# Patient Record
Sex: Male | Born: 1935 | Race: White | Hispanic: No | Marital: Married | Smoking: Current every day smoker
Health system: Southern US, Community
[De-identification: ages and names within clinical notes are randomized; demographics above are authoritative.]

## PROBLEM LIST (undated history)

## (undated) DIAGNOSIS — L409 Psoriasis, unspecified: Secondary | ICD-10-CM

## (undated) DIAGNOSIS — F32A Depression, unspecified: Secondary | ICD-10-CM

## (undated) DIAGNOSIS — J449 Chronic obstructive pulmonary disease, unspecified: Secondary | ICD-10-CM

## (undated) DIAGNOSIS — F329 Major depressive disorder, single episode, unspecified: Secondary | ICD-10-CM

## (undated) DIAGNOSIS — I110 Hypertensive heart disease with heart failure: Secondary | ICD-10-CM

## (undated) DIAGNOSIS — J439 Emphysema, unspecified: Secondary | ICD-10-CM

## (undated) DIAGNOSIS — G2581 Restless legs syndrome: Secondary | ICD-10-CM

## (undated) DIAGNOSIS — K219 Gastro-esophageal reflux disease without esophagitis: Secondary | ICD-10-CM

## (undated) HISTORY — PX: TOTAL HIP ARTHROPLASTY: SHX124

## (undated) HISTORY — PX: CHOLECYSTECTOMY: SHX55

## (undated) HISTORY — PX: BACK SURGERY: SHX140

## (undated) HISTORY — PX: ROTATOR CUFF REPAIR: SHX139

---
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
## 2000-04-06 ENCOUNTER — Encounter: Admission: RE | Admit: 2000-04-06 | Discharge: 2000-04-06 | Payer: Self-pay | Admitting: *Deleted

## 2000-04-06 ENCOUNTER — Encounter: Payer: Self-pay | Admitting: *Deleted

## 2000-05-12 ENCOUNTER — Other Ambulatory Visit: Admission: RE | Admit: 2000-05-12 | Discharge: 2000-05-12 | Payer: Self-pay | Admitting: Urology

## 2000-06-13 ENCOUNTER — Observation Stay (HOSPITAL_COMMUNITY): Admission: RE | Admit: 2000-06-13 | Discharge: 2000-06-14 | Payer: Self-pay | Admitting: Specialist

## 2000-08-01 ENCOUNTER — Ambulatory Visit (HOSPITAL_COMMUNITY): Admission: RE | Admit: 2000-08-01 | Discharge: 2000-08-01 | Payer: Self-pay | Admitting: Gastroenterology

## 2004-12-29 ENCOUNTER — Encounter: Admission: RE | Admit: 2004-12-29 | Discharge: 2004-12-29 | Payer: Self-pay | Admitting: Ophthalmology

## 2004-12-31 ENCOUNTER — Ambulatory Visit (HOSPITAL_COMMUNITY): Admission: RE | Admit: 2004-12-31 | Discharge: 2004-12-31 | Payer: Self-pay | Admitting: Ophthalmology

## 2004-12-31 ENCOUNTER — Ambulatory Visit (HOSPITAL_BASED_OUTPATIENT_CLINIC_OR_DEPARTMENT_OTHER): Admission: RE | Admit: 2004-12-31 | Discharge: 2004-12-31 | Payer: Self-pay | Admitting: Ophthalmology

## 2005-07-15 ENCOUNTER — Ambulatory Visit (HOSPITAL_BASED_OUTPATIENT_CLINIC_OR_DEPARTMENT_OTHER): Admission: RE | Admit: 2005-07-15 | Discharge: 2005-07-15 | Payer: Self-pay | Admitting: Ophthalmology

## 2005-08-05 ENCOUNTER — Encounter: Admission: RE | Admit: 2005-08-05 | Discharge: 2005-08-05 | Payer: Self-pay | Admitting: Orthopedic Surgery

## 2005-09-12 ENCOUNTER — Inpatient Hospital Stay (HOSPITAL_COMMUNITY): Admission: RE | Admit: 2005-09-12 | Discharge: 2005-09-15 | Payer: Self-pay | Admitting: Orthopedic Surgery

## 2006-02-15 ENCOUNTER — Ambulatory Visit (HOSPITAL_COMMUNITY): Admission: RE | Admit: 2006-02-15 | Discharge: 2006-02-15 | Payer: Self-pay | Admitting: Orthopedic Surgery

## 2006-03-29 ENCOUNTER — Ambulatory Visit (HOSPITAL_COMMUNITY): Admission: RE | Admit: 2006-03-29 | Discharge: 2006-03-29 | Payer: Self-pay | Admitting: Neurosurgery

## 2006-05-19 ENCOUNTER — Inpatient Hospital Stay (HOSPITAL_COMMUNITY): Admission: RE | Admit: 2006-05-19 | Discharge: 2006-05-22 | Payer: Self-pay | Admitting: Orthopedic Surgery

## 2006-06-12 ENCOUNTER — Ambulatory Visit (HOSPITAL_COMMUNITY): Admission: RE | Admit: 2006-06-12 | Discharge: 2006-06-12 | Payer: Self-pay | Admitting: Family Medicine

## 2012-04-12 ENCOUNTER — Other Ambulatory Visit: Payer: Self-pay | Admitting: Oral Surgery

## 2012-04-12 ENCOUNTER — Ambulatory Visit
Admission: RE | Admit: 2012-04-12 | Discharge: 2012-04-12 | Disposition: A | Discharge status: Home / Self Care | Payer: Self-pay | Source: Ambulatory Visit | Attending: Oral Surgery | Admitting: Oral Surgery

## 2012-04-12 DIAGNOSIS — T189XXA Foreign body of alimentary tract, part unspecified, initial encounter: Secondary | ICD-10-CM

## 2013-04-16 ENCOUNTER — Encounter (HOSPITAL_COMMUNITY): Payer: Self-pay | Admitting: Emergency Medicine

## 2013-04-16 ENCOUNTER — Emergency Department (HOSPITAL_COMMUNITY)
Admission: EM | Admit: 2013-04-16 | Discharge: 2013-04-16 | Disposition: A | Discharge status: Home / Self Care | Payer: Medicare Other | Attending: Emergency Medicine | Admitting: Emergency Medicine

## 2013-04-16 DIAGNOSIS — L03211 Cellulitis of face: Secondary | ICD-10-CM | POA: Insufficient documentation

## 2013-04-16 DIAGNOSIS — F17200 Nicotine dependence, unspecified, uncomplicated: Secondary | ICD-10-CM | POA: Insufficient documentation

## 2013-04-16 DIAGNOSIS — Z8669 Personal history of other diseases of the nervous system and sense organs: Secondary | ICD-10-CM | POA: Insufficient documentation

## 2013-04-16 DIAGNOSIS — K219 Gastro-esophageal reflux disease without esophagitis: Secondary | ICD-10-CM | POA: Insufficient documentation

## 2013-04-16 DIAGNOSIS — Z792 Long term (current) use of antibiotics: Secondary | ICD-10-CM | POA: Insufficient documentation

## 2013-04-16 DIAGNOSIS — Z872 Personal history of diseases of the skin and subcutaneous tissue: Secondary | ICD-10-CM | POA: Insufficient documentation

## 2013-04-16 DIAGNOSIS — L0201 Cutaneous abscess of face: Secondary | ICD-10-CM | POA: Insufficient documentation

## 2013-04-16 DIAGNOSIS — Z7982 Long term (current) use of aspirin: Secondary | ICD-10-CM | POA: Insufficient documentation

## 2013-04-16 DIAGNOSIS — Z79899 Other long term (current) drug therapy: Secondary | ICD-10-CM | POA: Insufficient documentation

## 2013-04-16 HISTORY — DX: Psoriasis, unspecified: L40.9

## 2013-04-16 HISTORY — DX: Restless legs syndrome: G25.81

## 2013-04-16 HISTORY — DX: Gastro-esophageal reflux disease without esophagitis: K21.9

## 2013-04-16 LAB — CBC WITH DIFFERENTIAL/PLATELET
Basophils Absolute: 0 10*3/uL (ref 0.0–0.1)
Basophils Relative: 1 % (ref 0–1)
HCT: 44.8 % (ref 39.0–52.0)
Lymphs Abs: 1.4 10*3/uL (ref 0.7–4.0)
MCHC: 34.2 g/dL (ref 30.0–36.0)
MCV: 89.1 fL (ref 78.0–100.0)
Monocytes Relative: 12 % (ref 3–12)
Neutrophils Relative %: 65 % (ref 43–77)
Platelets: 225 10*3/uL (ref 150–400)
WBC: 7.2 10*3/uL (ref 4.0–10.5)

## 2013-04-16 LAB — BASIC METABOLIC PANEL
CO2: 24 mEq/L (ref 19–32)
Calcium: 8.8 mg/dL (ref 8.4–10.5)
Chloride: 102 mEq/L (ref 96–112)
Creatinine, Ser: 0.72 mg/dL (ref 0.50–1.35)
GFR calc Af Amer: >90 mL/min (ref >90)
Sodium: 137 mEq/L (ref 135–145)

## 2013-04-16 MED ORDER — CLINDAMYCIN HCL 150 MG PO CAPS: 450.0000 mg | ORAL_CAPSULE | Freq: Three times a day (TID) | ORAL | Status: DC

## 2013-04-16 MED ORDER — CLINDAMYCIN PHOSPHATE 600 MG/50ML IV SOLN
600.0000 mg | Freq: Once | INTRAVENOUS | Status: AC
  Administered 2013-04-16: 600 mg via INTRAVENOUS
  Filled 2013-04-16: qty 50

## 2013-04-16 NOTE — ED Notes (Signed)
Pt states that he started noticing a rash on his forehead on Friday night and went to see his PCP yesterday. Started taking cephalexin but today the rash has moved down and his eyes are very edematous. Dr. Gibson Ramp thinks this may be r/t his psoriasis and wanted to send him in for eval to see if he needs IV antibiotics. Pt alert and oriented.

## 2013-04-16 NOTE — ED Notes (Signed)
Comfort measures provided,  Bed adjusted per pt request,  IV antibiotic infusing ,  Pt is watching TV.  When pt is ready to be discharged home I am to call his wife  Jasmine December at [phone]

## 2013-04-17 NOTE — ED Provider Notes (Signed)
CSN: [id number]     Arrival date & time 04/16/13  2010 History   First MD Initiated Contact with Patient 04/16/13 2041     Chief Complaint  Patient presents with  . Facial Swelling   (Consider location/radiation/quality/duration/timing/severity/associated sxs/prior Treatment) HPI Comments: Pt presents w/ 2 days worsening redness of face, spreading from forehead now to nose and periorbital areas.  No fever, chills.  He was put on keflex by his dermatologist which he begun two days ago. He states his forehead was edematous yesterday, and today has periorbital edema.  Patient is a 79 y.o. male presenting with rash.  Rash Location:  Face Facial rash location:  L eyelid, R eyelid, forehead and nose Quality: redness   Severity:  Mild Onset quality:  Gradual Duration:  4 days Timing:  Constant Progression:  Worsening Chronicity:  New Relieved by:  Nothing Worsened by:  Nothing tried Ineffective treatments: keflex. Associated symptoms: periorbital edema   Associated symptoms: no abdominal pain, no diarrhea, no fatigue, no fever, no headaches, no myalgias, no nausea, no shortness of breath and not vomiting     Past Medical History  Diagnosis Date  . Restless leg   . Psoriasis   . GERD (gastroesophageal reflux disease)    Past Surgical History  Procedure Laterality Date  . Rotator cuff repair Bilateral   . Back surgery    . Cholecystectomy    . Total hip arthroplasty Left    No family history on file. History  Substance Use Topics  . Smoking status: Current Every Day Smoker    Types: Pipe  . Smokeless tobacco: Not on file  . Alcohol Use: 7.0 oz/week    14 drink(s) per week    Review of Systems  Constitutional: Negative for fever, activity change, appetite change and fatigue.  HENT: Negative for congestion, facial swelling, rhinorrhea and trouble swallowing.   Eyes: Negative for photophobia and pain.  Respiratory: Negative for cough, chest tightness and shortness of  breath.   Cardiovascular: Negative for chest pain and leg swelling.  Gastrointestinal: Negative for nausea, vomiting, abdominal pain, diarrhea and constipation.  Endocrine: Negative for polydipsia and polyuria.  Genitourinary: Negative for dysuria, urgency, decreased urine volume and difficulty urinating.  Musculoskeletal: Negative for back pain, gait problem and myalgias.  Skin: Positive for rash. Negative for color change and wound.  Allergic/Immunologic: Negative for immunocompromised state.  Neurological: Negative for dizziness, facial asymmetry, speech difficulty, weakness, numbness and headaches.  Psychiatric/Behavioral: Negative for confusion, decreased concentration and agitation.    Allergies  Review of patient's allergies indicates no known allergies.  Home Medications   Current Outpatient Rx  Name  Route  Sig  Dispense  Refill  . aspirin (BAYER ADVANCED ASPIRIN REG ST) 325 MG tablet   Oral   Take 325 mg by mouth daily.         Marland Kitchen esomeprazole (NEXIUM) 20 MG capsule   Oral   Take 20 mg by mouth daily before breakfast.         . pramipexole (MIRAPEX) 1 MG tablet   Oral   Take 0.5 mg by mouth 3 (three) times daily.         . traZODone (DESYREL) 100 MG tablet   Oral   Take 200 mg by mouth at bedtime.         . clindamycin (CLEOCIN) 150 MG capsule   Oral   Take 3 capsules (450 mg total) by mouth 3 (three) times daily.   63 capsule  0    BP 146/88  Pulse 78  Temp(Src) 98.1 F (36.7 C) (Oral)  Resp 18  SpO2 92% Physical Exam  Constitutional: He is oriented to person, place, and time. He appears well-developed and well-nourished. No distress.  HENT:  Head: Normocephalic and atraumatic.    Mouth/Throat: No oropharyngeal exudate.  Erythema of face.  BL periorbital edema  Eyes: Pupils are equal, round, and reactive to light.  Neck: Normal range of motion. Neck supple.  Cardiovascular: Normal rate, regular rhythm and normal heart sounds.  Exam reveals  no gallop and no friction rub.   No murmur heard. Pulmonary/Chest: Effort normal and breath sounds normal. No respiratory distress. He has no wheezes. He has no rales.  Abdominal: Soft. Bowel sounds are normal. He exhibits no distension and no mass. There is no tenderness. There is no rebound and no guarding.  Musculoskeletal: Normal range of motion. He exhibits no edema and no tenderness.  Neurological: He is alert and oriented to person, place, and time.  Skin: Skin is warm and dry.  Psychiatric: He has a normal mood and affect.    ED Course  Procedures (including critical care time) Labs Review Labs Reviewed  BASIC METABOLIC PANEL - Abnormal; Notable for the following:    Glucose, Bld 122 (*)    GFR calc non Af Amer 83 (*)    All other components within normal limits  CBC WITH DIFFERENTIAL   Imaging Review No results found.  EKG Interpretation   None       MDM   1. Facial cellulitis    Pt presents w/ 2 days worsening redness of face, spreading from forehead now to nose and periorbital areas.  No fever, chills.  He was put on keflex by his dermatologist which he begun two days ago. He states his forehead was edematous yesterday, and today has periorbital edema.  On PE, VSS, pt in NAD, does not appear systemically ill. He has facial cellulitis, likely stemming from area of psoriasis on his forehead.  He has bilateral periorbital edema, no pain w/ EOM.  I doubt orbital cellulitis.  Given hx of edematous forehead yesterday, edema of eyes today may be gravitational. I have given pt dose of IV clindamycin in dept.  He does not want to stay for continued IV abx.  Area marked.  He will return to ED if symptoms worsen.  He otherwise has f/u derm appt in 6 days. I spok to pt's dermatologist, Dr. Gibson Ramp, who confirms. Will switch to PO clinda.     Shanna Cisco, MD 04/17/13 1118

## 2014-09-08 LAB — HEMOGLOBIN A1C: Hemoglobin A1C: 6.4

## 2015-10-07 DIAGNOSIS — G4700 Insomnia, unspecified: Secondary | ICD-10-CM | POA: Diagnosis not present

## 2015-10-07 DIAGNOSIS — G2581 Restless legs syndrome: Secondary | ICD-10-CM | POA: Diagnosis not present

## 2015-10-07 DIAGNOSIS — R42 Dizziness and giddiness: Secondary | ICD-10-CM | POA: Diagnosis not present

## 2015-10-07 DIAGNOSIS — R739 Hyperglycemia, unspecified: Secondary | ICD-10-CM | POA: Diagnosis not present

## 2015-10-07 DIAGNOSIS — L853 Xerosis cutis: Secondary | ICD-10-CM | POA: Diagnosis not present

## 2015-10-07 DIAGNOSIS — M151 Heberden's nodes (with arthropathy): Secondary | ICD-10-CM | POA: Diagnosis not present

## 2015-10-07 DIAGNOSIS — H9311 Tinnitus, right ear: Secondary | ICD-10-CM | POA: Diagnosis not present

## 2015-10-07 DIAGNOSIS — K219 Gastro-esophageal reflux disease without esophagitis: Secondary | ICD-10-CM | POA: Diagnosis not present

## 2015-10-07 DIAGNOSIS — J439 Emphysema, unspecified: Secondary | ICD-10-CM | POA: Diagnosis not present

## 2015-11-13 DIAGNOSIS — M7122 Synovial cyst of popliteal space [Baker], left knee: Secondary | ICD-10-CM | POA: Diagnosis not present

## 2015-11-13 DIAGNOSIS — M1712 Unilateral primary osteoarthritis, left knee: Secondary | ICD-10-CM | POA: Diagnosis not present

## 2015-11-13 DIAGNOSIS — F17200 Nicotine dependence, unspecified, uncomplicated: Secondary | ICD-10-CM | POA: Diagnosis not present

## 2015-11-13 DIAGNOSIS — E6609 Other obesity due to excess calories: Secondary | ICD-10-CM | POA: Diagnosis not present

## 2015-11-13 DIAGNOSIS — Z6832 Body mass index (BMI) 32.0-32.9, adult: Secondary | ICD-10-CM | POA: Diagnosis not present

## 2015-11-13 DIAGNOSIS — M25562 Pain in left knee: Secondary | ICD-10-CM | POA: Diagnosis not present

## 2015-11-13 DIAGNOSIS — M25462 Effusion, left knee: Secondary | ICD-10-CM | POA: Diagnosis not present

## 2015-11-13 DIAGNOSIS — Z7982 Long term (current) use of aspirin: Secondary | ICD-10-CM | POA: Diagnosis not present

## 2016-05-23 DIAGNOSIS — L409 Psoriasis, unspecified: Secondary | ICD-10-CM | POA: Diagnosis not present

## 2016-05-23 DIAGNOSIS — L820 Inflamed seborrheic keratosis: Secondary | ICD-10-CM | POA: Diagnosis not present

## 2016-06-15 DIAGNOSIS — Z947 Corneal transplant status: Secondary | ICD-10-CM | POA: Diagnosis not present

## 2016-06-15 DIAGNOSIS — Z961 Presence of intraocular lens: Secondary | ICD-10-CM | POA: Diagnosis not present

## 2016-06-15 DIAGNOSIS — H1851 Endothelial corneal dystrophy: Secondary | ICD-10-CM | POA: Diagnosis not present

## 2016-10-20 ENCOUNTER — Encounter (HOSPITAL_COMMUNITY): Payer: Self-pay | Admitting: Emergency Medicine

## 2016-10-20 ENCOUNTER — Emergency Department (HOSPITAL_COMMUNITY)
Admission: EM | Admit: 2016-10-20 | Discharge: 2016-10-20 | Disposition: A | Discharge status: Home / Self Care | Payer: Medicare Other | Attending: Emergency Medicine | Admitting: Emergency Medicine

## 2016-10-20 DIAGNOSIS — Z7982 Long term (current) use of aspirin: Secondary | ICD-10-CM | POA: Insufficient documentation

## 2016-10-20 DIAGNOSIS — Z96642 Presence of left artificial hip joint: Secondary | ICD-10-CM | POA: Diagnosis not present

## 2016-10-20 DIAGNOSIS — H81399 Other peripheral vertigo, unspecified ear: Secondary | ICD-10-CM | POA: Diagnosis not present

## 2016-10-20 DIAGNOSIS — Z79899 Other long term (current) drug therapy: Secondary | ICD-10-CM | POA: Diagnosis not present

## 2016-10-20 DIAGNOSIS — R42 Dizziness and giddiness: Secondary | ICD-10-CM | POA: Diagnosis present

## 2016-10-20 DIAGNOSIS — H8110 Benign paroxysmal vertigo, unspecified ear: Secondary | ICD-10-CM | POA: Diagnosis not present

## 2016-10-20 DIAGNOSIS — F17290 Nicotine dependence, other tobacco product, uncomplicated: Secondary | ICD-10-CM | POA: Diagnosis not present

## 2016-10-20 LAB — CBC
HCT: 50.8 % (ref 39.0–52.0)
HEMOGLOBIN: 16.9 g/dL (ref 13.0–17.0)
MCH: 30.4 pg (ref 26.0–34.0)
MCHC: 33.3 g/dL (ref 30.0–36.0)
MCV: 91.4 fL (ref 78.0–100.0)
PLATELETS: 158 10*3/uL (ref 150–400)
RBC: 5.56 MIL/uL (ref 4.22–5.81)
RDW: 15.9 % — ABNORMAL HIGH (ref 11.5–15.5)
WBC: 6.7 10*3/uL (ref 4.0–10.5)

## 2016-10-20 LAB — BASIC METABOLIC PANEL
ANION GAP: 10 (ref 5–15)
BUN: 23 mg/dL — ABNORMAL HIGH (ref 6–20)
CALCIUM: 9 mg/dL (ref 8.9–10.3)
CO2: 26 mmol/L (ref 22–32)
CREATININE: 0.74 mg/dL (ref 0.61–1.24)
Chloride: 106 mmol/L (ref 101–111)
GLUCOSE: 123 mg/dL — AB (ref 65–99)
Potassium: 4.6 mmol/L (ref 3.5–5.1)
Sodium: 142 mmol/L (ref 135–145)

## 2016-10-20 LAB — MAGNESIUM: MAGNESIUM: 2 mg/dL (ref 1.7–2.4)

## 2016-10-20 MED ORDER — MECLIZINE HCL 25 MG PO TABS
50.0000 mg | ORAL_TABLET | Freq: Once | ORAL | Status: AC
  Administered 2016-10-20: 50 mg via ORAL
  Filled 2016-10-20: qty 2

## 2016-10-20 MED ORDER — MECLIZINE HCL 25 MG PO TABS: 25.0000 mg | ORAL_TABLET | Freq: Three times a day (TID) | ORAL | 0 refills | Status: DC | PRN

## 2016-10-20 NOTE — ED Notes (Signed)
Patient waling around room with steady gait.

## 2016-10-20 NOTE — ED Triage Notes (Signed)
Pt reports chronic dizziness when he extends neck or moves. Today worse at 0400, now getting better. Yesterday got dizzy while swimming, which is unusual. No other symptoms.

## 2016-10-20 NOTE — ED Provider Notes (Signed)
[facility] DEPT Provider Note   CSN: [id number] Arrival date & time: 10/20/16  1116     History   Chief Complaint Chief Complaint  Patient presents with  . Dizziness    HPI Warren White is a 83 y.o. male.  HPI Pt comes in with cc of dizziness. Pt is healthy, has hx of restless leg syndrome. He has no hx of strokes or CAD. Pt reports that he swims 3 times a week, and yday while he was swimming, he started having spinning sensation. Pt's symptoms lasted just a few seconds and resolved on their own. He was fine until 4 am, when he woke up due to his restless leg syndrome. Pt noted that when he got up, he was dizzy again and was having some spinning sensation. Pt was able to make it to the bathroom and back, and went to bed again. As the day has progressed today, he has had off and on episodes of dizziness.   Pt reports hx of vertigo and dizziness. But the current symptoms are more recurrent. Pt reports that his symptoms are only present, when he moves his head or when he gets up. Pt has no associated nausea, vomiting, seizures, loss of consciousness or new visual complains, focal weakness, numbness, dizziness or gait instability. Pt currently has no dizziness.  Pt reports no ear aches. He has decreased hearing in his L ear (not new), and intermittent tinnitus. Pt is not on any new meds.  Past Medical History:  Diagnosis Date  . GERD (gastroesophageal reflux disease)   . Psoriasis   . Restless leg     There are no active problems to display for this patient.   Past Surgical History:  Procedure Laterality Date  . BACK SURGERY    . CHOLECYSTECTOMY    . ROTATOR CUFF REPAIR Bilateral   . TOTAL HIP ARTHROPLASTY Left        Home Medications    Prior to Admission medications   Medication Sig Start Date End Date Taking? Authorizing Provider  naproxen sodium (ANAPROX) 220 MG tablet Take 220 mg by mouth 2 (two) times daily with a meal.   Yes Historical Provider, MD    pantoprazole (PROTONIX) 40 MG tablet Take 40 mg by mouth daily. 08/03/16  Yes Historical Provider, MD  pramipexole (MIRAPEX) 0.5 MG tablet Take 0.25 mg by mouth 2 (two) times daily.  08/29/16  Yes Historical Provider, MD  traZODone (DESYREL) 100 MG tablet Take 200 mg by mouth at bedtime.   Yes Historical Provider, MD  aspirin (BAYER ADVANCED ASPIRIN REG ST) 325 MG tablet Take 325 mg by mouth daily.    Historical Provider, MD  meclizine (ANTIVERT) 25 MG tablet Take 1 tablet (25 mg total) by mouth 3 (three) times daily as needed for dizziness. 10/20/16   Derwood Kaplan, MD    Family History History reviewed. No pertinent family history.  Social History Social History  Substance Use Topics  . Smoking status: Current Every Day Smoker    Types: Pipe  . Smokeless tobacco: Not on file  . Alcohol use 7.0 oz/week    14 drink(s) per week     Allergies   Patient has no known allergies.   Review of Systems Review of Systems  Neurological: Positive for dizziness.  All other systems reviewed and are negative.    Physical Exam Updated Vital Signs BP (!) 161/101   Pulse 74   Temp 97.7 F (36.5 C)   Resp 16   Ht 5'  8" (1.727 m)   Wt 192 lb (87.1 kg)   SpO2 93%   BMI 29.19 kg/m   Physical Exam  Constitutional: He is oriented to person, place, and time. He appears well-developed.  HENT:  Head: Normocephalic and atraumatic.  No nystagmus  Eyes: Conjunctivae and EOM are normal. Pupils are equal, round, and reactive to light.  Neck: Normal range of motion. Neck supple.  Cardiovascular: Normal rate and regular rhythm.   Pulmonary/Chest: Effort normal and breath sounds normal.  Abdominal: Soft. Bowel sounds are normal. He exhibits no distension. There is no tenderness. There is no guarding.  Musculoskeletal: He exhibits no deformity.  Neurological: He is alert and oriented to person, place, and time. No cranial nerve deficit. Coordination normal.  Cerebellar exam is normal (finger to  nose) Sensory exam normal for bilateral upper and lower extremities - and patient is able to discriminate between sharp and dull. Motor exam is 4+/5 Pt ambulated without any ataxia   Skin: Skin is warm.  Nursing note and vitals reviewed.    ED Treatments / Results  Labs (all labs ordered are listed, but only abnormal results are displayed) Labs Reviewed  BASIC METABOLIC PANEL - Abnormal; Notable for the following:       Result Value   Glucose, Bld 123 (*)    BUN 23 (*)    All other components within normal limits  CBC - Abnormal; Notable for the following:    RDW 15.9 (*)    All other components within normal limits  MAGNESIUM    EKG  EKG Interpretation  Date/Time:  Thursday October 20 2016 11:23:35 EDT Ventricular Rate:  76 PR Interval:    QRS Duration: 132 QT Interval:  415 QTC Calculation: 467 R Axis:   -63 Text Interpretation:  Sinus rhythm Nonspecific IVCD with LAD LVH with secondary repolarization abnormality Anterior ST elevation, probably due to LVH No acute changes Confirmed by Rhunette Croft, MD, Janey Genta ([id number]) on 10/20/2016 11:51:21 AM       Radiology No results found.  Procedures Procedures (including critical care time)  Medications Ordered in ED Medications  meclizine (ANTIVERT) tablet 50 mg (50 mg Oral Given 10/20/16 1249)     Initial Impression / Assessment and Plan / ED Course  I have reviewed the triage vital signs and the nursing notes.  Pertinent labs & imaging results that were available during my care of the patient were reviewed by me and considered in my medical decision making (see chart for details).     Pt comes in with cc of vertigo/ dizziness.  Pt is having dizziness. Dizziness is intermittent. Dizziness is described as vertigo, but then also unsteadiness. Pt's neuro exam is nonfocal. There is no falls, headaches. Pt's symptoms are worse with position - when he gets up or when he moves his head up and down. Pt was able to ambulate  well. Pt is currently asymptomatic.  I informed patient that we have to be a bit concerned about TIA/stroke - especially if the symptoms were lasting longer than usual. He was not too keen on MRI and staying for it, especially since he feels great.  Pt was given the aggressive option of getting MRI now and r.o stroke vs.  Starting meclezine, close outpatient f/u with ENT and PCP and strictly return to the ER if his symptoms get worse / more constant.  Pt w/o hesitation picked the latter choice, understanding, that we havent r/o stroke. Pt reported that his wife just had a stroke,  so he will be mindful of coming in immediately if his symptoms change or worsen.  Basic labs and orthostatics are neg. We will d/c.  Final Clinical Impressions(s) / ED Diagnoses   Final diagnoses:  Peripheral vertigo, unspecified laterality  Dizziness    New Prescriptions Discharge Medication List as of 10/20/2016  2:13 PM    START taking these medications   Details  meclizine (ANTIVERT) 25 MG tablet Take 1 tablet (25 mg total) by mouth 3 (three) times daily as needed for dizziness., Starting Thu 10/20/2016, Print         Derwood Kaplan, MD 10/20/16 1630

## 2016-10-20 NOTE — Discharge Instructions (Signed)
Please do the exercises that we have recommended. See your ENT doctor or primary doctor soon. Please return to the ER if the symptoms get constant. Please return to the ER if there is increased one sided numbness, tingling, weakness or confusion, seizures, poor balance or poor vision.

## 2016-10-20 NOTE — ED Notes (Signed)
Pt has ice water 

## 2016-12-08 DIAGNOSIS — H8112 Benign paroxysmal vertigo, left ear: Secondary | ICD-10-CM | POA: Diagnosis not present

## 2016-12-14 DIAGNOSIS — J439 Emphysema, unspecified: Secondary | ICD-10-CM | POA: Diagnosis not present

## 2016-12-14 DIAGNOSIS — G2581 Restless legs syndrome: Secondary | ICD-10-CM | POA: Diagnosis not present

## 2016-12-14 DIAGNOSIS — G4700 Insomnia, unspecified: Secondary | ICD-10-CM | POA: Diagnosis not present

## 2016-12-14 DIAGNOSIS — K219 Gastro-esophageal reflux disease without esophagitis: Secondary | ICD-10-CM | POA: Diagnosis not present

## 2016-12-14 DIAGNOSIS — R739 Hyperglycemia, unspecified: Secondary | ICD-10-CM | POA: Diagnosis not present

## 2017-01-24 DIAGNOSIS — J441 Chronic obstructive pulmonary disease with (acute) exacerbation: Secondary | ICD-10-CM | POA: Diagnosis not present

## 2017-01-24 DIAGNOSIS — J410 Simple chronic bronchitis: Secondary | ICD-10-CM | POA: Diagnosis not present

## 2017-02-02 ENCOUNTER — Inpatient Hospital Stay (HOSPITAL_COMMUNITY)
Admission: EM | Admit: 2017-02-02 | Discharge: 2017-02-07 | DRG: 291 | Disposition: A | Discharge status: Home Health | Payer: Medicare Other | Attending: Family Medicine | Admitting: Family Medicine

## 2017-02-02 ENCOUNTER — Emergency Department (HOSPITAL_COMMUNITY): Payer: Medicare Other

## 2017-02-02 ENCOUNTER — Encounter (HOSPITAL_COMMUNITY): Payer: Self-pay | Admitting: Emergency Medicine

## 2017-02-02 DIAGNOSIS — N401 Enlarged prostate with lower urinary tract symptoms: Secondary | ICD-10-CM | POA: Diagnosis not present

## 2017-02-02 DIAGNOSIS — E873 Alkalosis: Secondary | ICD-10-CM | POA: Diagnosis not present

## 2017-02-02 DIAGNOSIS — Z515 Encounter for palliative care: Secondary | ICD-10-CM | POA: Diagnosis present

## 2017-02-02 DIAGNOSIS — F40240 Claustrophobia: Secondary | ICD-10-CM | POA: Diagnosis present

## 2017-02-02 DIAGNOSIS — Z79899 Other long term (current) drug therapy: Secondary | ICD-10-CM

## 2017-02-02 DIAGNOSIS — J441 Chronic obstructive pulmonary disease with (acute) exacerbation: Secondary | ICD-10-CM | POA: Diagnosis present

## 2017-02-02 DIAGNOSIS — R944 Abnormal results of kidney function studies: Secondary | ICD-10-CM | POA: Diagnosis not present

## 2017-02-02 DIAGNOSIS — Z7982 Long term (current) use of aspirin: Secondary | ICD-10-CM | POA: Diagnosis not present

## 2017-02-02 DIAGNOSIS — J449 Chronic obstructive pulmonary disease, unspecified: Secondary | ICD-10-CM | POA: Diagnosis present

## 2017-02-02 DIAGNOSIS — J9601 Acute respiratory failure with hypoxia: Secondary | ICD-10-CM | POA: Diagnosis not present

## 2017-02-02 DIAGNOSIS — I5021 Acute systolic (congestive) heart failure: Secondary | ICD-10-CM | POA: Diagnosis not present

## 2017-02-02 DIAGNOSIS — R0602 Shortness of breath: Secondary | ICD-10-CM | POA: Diagnosis not present

## 2017-02-02 DIAGNOSIS — L409 Psoriasis, unspecified: Secondary | ICD-10-CM | POA: Diagnosis present

## 2017-02-02 DIAGNOSIS — Z7189 Other specified counseling: Secondary | ICD-10-CM

## 2017-02-02 DIAGNOSIS — I517 Cardiomegaly: Secondary | ICD-10-CM | POA: Diagnosis not present

## 2017-02-02 DIAGNOSIS — J45901 Unspecified asthma with (acute) exacerbation: Secondary | ICD-10-CM

## 2017-02-02 DIAGNOSIS — R0600 Dyspnea, unspecified: Secondary | ICD-10-CM | POA: Diagnosis present

## 2017-02-02 DIAGNOSIS — I110 Hypertensive heart disease with heart failure: Secondary | ICD-10-CM | POA: Diagnosis present

## 2017-02-02 DIAGNOSIS — F17290 Nicotine dependence, other tobacco product, uncomplicated: Secondary | ICD-10-CM | POA: Diagnosis present

## 2017-02-02 DIAGNOSIS — K219 Gastro-esophageal reflux disease without esophagitis: Secondary | ICD-10-CM | POA: Diagnosis present

## 2017-02-02 DIAGNOSIS — G2581 Restless legs syndrome: Secondary | ICD-10-CM | POA: Diagnosis present

## 2017-02-02 DIAGNOSIS — I5043 Acute on chronic combined systolic (congestive) and diastolic (congestive) heart failure: Secondary | ICD-10-CM | POA: Diagnosis present

## 2017-02-02 DIAGNOSIS — N400 Enlarged prostate without lower urinary tract symptoms: Secondary | ICD-10-CM | POA: Diagnosis present

## 2017-02-02 DIAGNOSIS — Z66 Do not resuscitate: Secondary | ICD-10-CM | POA: Diagnosis present

## 2017-02-02 DIAGNOSIS — J9691 Respiratory failure, unspecified with hypoxia: Secondary | ICD-10-CM | POA: Diagnosis present

## 2017-02-02 DIAGNOSIS — I340 Nonrheumatic mitral (valve) insufficiency: Secondary | ICD-10-CM | POA: Diagnosis not present

## 2017-02-02 DIAGNOSIS — H8110 Benign paroxysmal vertigo, unspecified ear: Secondary | ICD-10-CM | POA: Diagnosis present

## 2017-02-02 DIAGNOSIS — I5032 Chronic diastolic (congestive) heart failure: Secondary | ICD-10-CM | POA: Diagnosis present

## 2017-02-02 DIAGNOSIS — R7303 Prediabetes: Secondary | ICD-10-CM | POA: Diagnosis present

## 2017-02-02 DIAGNOSIS — F17210 Nicotine dependence, cigarettes, uncomplicated: Secondary | ICD-10-CM | POA: Diagnosis not present

## 2017-02-02 DIAGNOSIS — R0609 Other forms of dyspnea: Secondary | ICD-10-CM | POA: Diagnosis not present

## 2017-02-02 HISTORY — DX: Chronic obstructive pulmonary disease, unspecified: J44.9

## 2017-02-02 HISTORY — DX: Emphysema, unspecified: J43.9

## 2017-02-02 LAB — BASIC METABOLIC PANEL
Anion gap: 8 (ref 5–15)
BUN: 19 mg/dL (ref 6–20)
CALCIUM: 8.8 mg/dL — AB (ref 8.9–10.3)
CO2: 29 mmol/L (ref 22–32)
CREATININE: 0.82 mg/dL (ref 0.61–1.24)
Chloride: 105 mmol/L (ref 101–111)
GFR calc Af Amer: >60 mL/min (ref >60)
GFR calc non Af Amer: >60 mL/min (ref >60)
GLUCOSE: 153 mg/dL — AB (ref 65–99)
Potassium: 4.4 mmol/L (ref 3.5–5.1)
Sodium: 142 mmol/L (ref 135–145)

## 2017-02-02 LAB — CBC
HCT: 51.4 % (ref 39.0–52.0)
Hemoglobin: 17 g/dL (ref 13.0–17.0)
MCH: 30.6 pg (ref 26.0–34.0)
MCHC: 33.1 g/dL (ref 30.0–36.0)
MCV: 92.6 fL (ref 78.0–100.0)
PLATELETS: 222 10*3/uL (ref 150–400)
RBC: 5.55 MIL/uL (ref 4.22–5.81)
RDW: 15.7 % — ABNORMAL HIGH (ref 11.5–15.5)
WBC: 10.2 10*3/uL (ref 4.0–10.5)

## 2017-02-02 LAB — BRAIN NATRIURETIC PEPTIDE: B NATRIURETIC PEPTIDE 5: 363.3 pg/mL — AB (ref 0.0–100.0)

## 2017-02-02 LAB — MRSA PCR SCREENING: MRSA by PCR: NEGATIVE

## 2017-02-02 LAB — POCT I-STAT TROPONIN I: Troponin i, poc: 0.05 ng/mL (ref 0.00–0.08)

## 2017-02-02 MED ORDER — SODIUM CHLORIDE 0.9% FLUSH: 3.0000 mL | INTRAVENOUS | Status: DC | PRN

## 2017-02-02 MED ORDER — ALBUTEROL SULFATE (2.5 MG/3ML) 0.083% IN NEBU
5.0000 mg | INHALATION_SOLUTION | Freq: Once | RESPIRATORY_TRACT | Status: AC
  Administered 2017-02-02: 5 mg via RESPIRATORY_TRACT
  Filled 2017-02-02: qty 6

## 2017-02-02 MED ORDER — ACETAMINOPHEN 325 MG PO TABS: 650.0000 mg | ORAL_TABLET | Freq: Four times a day (QID) | ORAL | Status: DC | PRN

## 2017-02-02 MED ORDER — ASPIRIN 325 MG PO TABS
325.0000 mg | ORAL_TABLET | Freq: Every day | ORAL | Status: DC
  Administered 2017-02-03 – 2017-02-07 (×5): 325 mg via ORAL
  Filled 2017-02-02 (×5): qty 1

## 2017-02-02 MED ORDER — PRAMIPEXOLE DIHYDROCHLORIDE 0.25 MG PO TABS: 0.2500 mg | ORAL_TABLET | Freq: Two times a day (BID) | ORAL | Status: DC

## 2017-02-02 MED ORDER — SODIUM CHLORIDE 0.9 % IV SOLN: 250.0000 mL | INTRAVENOUS | Status: DC | PRN

## 2017-02-02 MED ORDER — PRAMIPEXOLE DIHYDROCHLORIDE 0.25 MG PO TABS
0.2500 mg | ORAL_TABLET | Freq: Two times a day (BID) | ORAL | Status: DC
  Administered 2017-02-02 – 2017-02-03 (×2): 0.25 mg via ORAL
  Filled 2017-02-02 (×3): qty 1

## 2017-02-02 MED ORDER — ALBUTEROL (5 MG/ML) CONTINUOUS INHALATION SOLN
10.0000 mg/h | INHALATION_SOLUTION | Freq: Once | RESPIRATORY_TRACT | Status: AC
  Administered 2017-02-02: 10 mg/h via RESPIRATORY_TRACT
  Filled 2017-02-02: qty 20

## 2017-02-02 MED ORDER — ENOXAPARIN SODIUM 40 MG/0.4ML ~~LOC~~ SOLN
40.0000 mg | SUBCUTANEOUS | Status: DC
  Administered 2017-02-02 – 2017-02-06 (×5): 40 mg via SUBCUTANEOUS
  Filled 2017-02-02 (×5): qty 0.4

## 2017-02-02 MED ORDER — NICOTINE 21 MG/24HR TD PT24
21.0000 mg | MEDICATED_PATCH | Freq: Every day | TRANSDERMAL | Status: DC
  Administered 2017-02-02 – 2017-02-06 (×5): 21 mg via TRANSDERMAL
  Filled 2017-02-02 (×5): qty 1

## 2017-02-02 MED ORDER — IPRATROPIUM-ALBUTEROL 0.5-2.5 (3) MG/3ML IN SOLN
3.0000 mL | Freq: Four times a day (QID) | RESPIRATORY_TRACT | Status: DC
  Administered 2017-02-03 – 2017-02-06 (×13): 3 mL via RESPIRATORY_TRACT
  Filled 2017-02-02 (×15): qty 3

## 2017-02-02 MED ORDER — AZITHROMYCIN 250 MG PO TABS: 500.0000 mg | ORAL_TABLET | Freq: Once | ORAL | Status: DC

## 2017-02-02 MED ORDER — ACETAMINOPHEN 650 MG RE SUPP: 650.0000 mg | Freq: Four times a day (QID) | RECTAL | Status: DC | PRN

## 2017-02-02 MED ORDER — NAPROXEN 250 MG PO TABS
250.0000 mg | ORAL_TABLET | Freq: Two times a day (BID) | ORAL | Status: DC
  Administered 2017-02-02 – 2017-02-03 (×3): 250 mg via ORAL
  Filled 2017-02-02 (×4): qty 1

## 2017-02-02 MED ORDER — PRAMIPEXOLE DIHYDROCHLORIDE 0.25 MG PO TABS
0.2500 mg | ORAL_TABLET | Freq: Once | ORAL | Status: AC
  Administered 2017-02-02: 0.25 mg via ORAL
  Filled 2017-02-02: qty 1

## 2017-02-02 MED ORDER — MECLIZINE HCL 25 MG PO TABS
25.0000 mg | ORAL_TABLET | Freq: Three times a day (TID) | ORAL | Status: DC | PRN
  Filled 2017-02-02: qty 1

## 2017-02-02 MED ORDER — METHYLPREDNISOLONE SODIUM SUCC 125 MG IJ SOLR
125.0000 mg | Freq: Once | INTRAMUSCULAR | Status: AC
  Administered 2017-02-02: 125 mg via INTRAVENOUS
  Filled 2017-02-02: qty 2

## 2017-02-02 MED ORDER — AZITHROMYCIN 250 MG PO TABS: 250.0000 mg | ORAL_TABLET | Freq: Every day | ORAL | Status: DC

## 2017-02-02 MED ORDER — POLYETHYLENE GLYCOL 3350 17 G PO PACK: 17.0000 g | PACK | Freq: Every day | ORAL | Status: DC | PRN

## 2017-02-02 MED ORDER — SODIUM CHLORIDE 0.9% FLUSH
3.0000 mL | Freq: Two times a day (BID) | INTRAVENOUS | Status: DC
  Administered 2017-02-02 – 2017-02-07 (×10): 3 mL via INTRAVENOUS

## 2017-02-02 MED ORDER — NAPROXEN SODIUM 220 MG PO TABS: 220.0000 mg | ORAL_TABLET | Freq: Two times a day (BID) | ORAL | Status: DC

## 2017-02-02 MED ORDER — IPRATROPIUM BROMIDE 0.02 % IN SOLN: 0.5000 mg | Freq: Once | RESPIRATORY_TRACT | Status: DC

## 2017-02-02 MED ORDER — ALBUTEROL SULFATE (2.5 MG/3ML) 0.083% IN NEBU
INHALATION_SOLUTION | RESPIRATORY_TRACT | Status: AC
  Administered 2017-02-02: 5 mg
  Filled 2017-02-02: qty 6

## 2017-02-02 MED ORDER — PREDNISONE 20 MG PO TABS
40.0000 mg | ORAL_TABLET | Freq: Every day | ORAL | Status: AC
  Administered 2017-02-03 – 2017-02-07 (×5): 40 mg via ORAL
  Filled 2017-02-02 (×5): qty 2

## 2017-02-02 MED ORDER — AZITHROMYCIN 250 MG PO TABS
250.0000 mg | ORAL_TABLET | Freq: Every day | ORAL | Status: AC
  Administered 2017-02-03 – 2017-02-06 (×4): 250 mg via ORAL
  Filled 2017-02-02 (×4): qty 1

## 2017-02-02 MED ORDER — DEXTROSE 5 % IV SOLN
500.0000 mg | Freq: Once | INTRAVENOUS | Status: AC
  Administered 2017-02-02: 500 mg via INTRAVENOUS
  Filled 2017-02-02: qty 500

## 2017-02-02 MED ORDER — ALBUTEROL SULFATE (2.5 MG/3ML) 0.083% IN NEBU: 5.0000 mg | INHALATION_SOLUTION | Freq: Once | RESPIRATORY_TRACT | Status: DC

## 2017-02-02 MED ORDER — IPRATROPIUM-ALBUTEROL 0.5-2.5 (3) MG/3ML IN SOLN
3.0000 mL | RESPIRATORY_TRACT | Status: DC | PRN
  Administered 2017-02-02 – 2017-02-05 (×5): 3 mL via RESPIRATORY_TRACT
  Filled 2017-02-02 (×3): qty 3

## 2017-02-02 MED ORDER — PANTOPRAZOLE SODIUM 40 MG PO TBEC
40.0000 mg | DELAYED_RELEASE_TABLET | Freq: Every day | ORAL | Status: DC
  Administered 2017-02-02 – 2017-02-07 (×6): 40 mg via ORAL
  Filled 2017-02-02 (×6): qty 1

## 2017-02-02 MED ORDER — ORAL CARE MOUTH RINSE
15.0000 mL | Freq: Two times a day (BID) | OROMUCOSAL | Status: DC
  Administered 2017-02-02 – 2017-02-07 (×6): 15 mL via OROMUCOSAL

## 2017-02-02 NOTE — ED Triage Notes (Signed)
Pt states he has a hx of COPD and emphysema (no home O2) and states he has been increasingly SOB x 4 days. Alert and oriented. 85% RA.

## 2017-02-02 NOTE — ED Notes (Signed)
Robyn from respiratory called to evaluate patient.

## 2017-02-02 NOTE — ED Notes (Signed)
Respiratory at bedside.

## 2017-02-02 NOTE — ED Provider Notes (Signed)
[facility] Provider Note   CSN: [id number] Arrival date & time: 02/02/17  4098     History   Chief Complaint Chief Complaint  Patient presents with  . Shortness of Breath    HPI Warren White is a 83 y.o. male.  The history is provided by the patient.  Shortness of Breath  This is a recurrent problem. The current episode started more than 2 days ago. The problem has been gradually worsening. Associated symptoms include leg swelling. Pertinent negatives include no fever, no rhinorrhea, no sore throat, no cough, no sputum production, no chest pain, no syncope and no leg pain. It is unknown what precipitated the problem. He has tried ipratropium inhalers, beta-agonist inhalers and oral steroids for the symptoms. The treatment provided moderate relief. Associated medical issues include COPD.   83 year old male who presents with shortness of breath for the past 4 days. He has a history of COPD/emphysema but is not on chronic oxygen. Family states that he has not wanted to see a doctor for a long time but about 2 weeks ago did see the primary care doctor for shortness of breath. Was starting on breathing treatments as well as steroids. Finish these treatments but over the past 4 days has had worsening breathing. No chest pain, cough, congestion, fevers or chills. Reports chronic pedal edema. Hypoxia on room air to 85% on arrival.    Past Medical History:  Diagnosis Date  . COPD (chronic obstructive pulmonary disease) (HCC)   . Emphysema of lung (HCC)   . GERD (gastroesophageal reflux disease)   . Psoriasis   . Restless leg     There are no active problems to display for this patient.   Past Surgical History:  Procedure Laterality Date  . BACK SURGERY    . CHOLECYSTECTOMY    . ROTATOR CUFF REPAIR Bilateral   . TOTAL HIP ARTHROPLASTY Left        Home Medications    Prior to Admission medications   Medication Sig Start Date End Date Taking? Authorizing Provider    aspirin (BAYER ADVANCED ASPIRIN REG ST) 325 MG tablet Take 325 mg by mouth daily.    [provider]  meclizine (ANTIVERT) 25 MG tablet Take 1 tablet (25 mg total) by mouth 3 (three) times daily as needed for dizziness. 10/20/16   Derwood Kaplan, MD  naproxen sodium (ANAPROX) 220 MG tablet Take 220 mg by mouth 2 (two) times daily with a meal.    [provider]  pantoprazole (PROTONIX) 40 MG tablet Take 40 mg by mouth daily. 08/03/16   [provider]  pramipexole (MIRAPEX) 0.5 MG tablet Take 0.25 mg by mouth 2 (two) times daily.  08/29/16   [provider]  traZODone (DESYREL) 100 MG tablet Take 200 mg by mouth at bedtime.    [provider]    Family History No family history on file.  Social History Social History  Substance Use Topics  . Smoking status: Current Every Day Smoker    Types: Pipe  . Smokeless tobacco: Not on file  . Alcohol use 7.0 oz/week    14 Standard drinks or equivalent per week     Allergies   Patient has no known allergies.   Review of Systems Review of Systems  Constitutional: Negative for fever.  HENT: Negative for rhinorrhea and sore throat.   Respiratory: Positive for shortness of breath. Negative for cough and sputum production.   Cardiovascular: Positive for leg swelling. Negative for chest pain  and syncope.  All other systems reviewed and are negative.    Physical Exam Updated Vital Signs BP (!) 138/92   Pulse 96   Temp 98.2 F (36.8 C) (Oral)   Resp (!) 30   SpO2 97%   Physical Exam Physical Exam  Nursing note and vitals reviewed. Constitutional: moderate respiratory distress, elderly appearing male Head: Normocephalic and atraumatic.  Mouth/Throat: Oropharynx is clear and moist.  Neck: Normal range of motion. Neck supple.  Cardiovascular: Tachycardic rate and regular rhythm.   Pulmonary/Chest: Effort normal. Diffuse expiratory wheezing in all lung fields.  Abdominal: Soft. There is no  tenderness. There is no rebound and no guarding.  Musculoskeletal: Normal range of motion. bilateral +1 edema Neurological: Alert, no facial droop, fluent speech, moves all extremities symmetrically Skin: Skin is warm and dry.  Psychiatric: Cooperative   ED Treatments / Results  Labs (all labs ordered are listed, but only abnormal results are displayed) Labs Reviewed  BASIC METABOLIC PANEL - Abnormal; Notable for the following:       Result Value   Glucose, Bld 153 (*)    Calcium 8.8 (*)    All other components within normal limits  CBC - Abnormal; Notable for the following:    RDW 15.7 (*)    All other components within normal limits  BRAIN NATRIURETIC PEPTIDE - Abnormal; Notable for the following:    B Natriuretic Peptide 363.3 (*)    All other components within normal limits  I-STAT TROPONIN, ED  POCT I-STAT TROPONIN I  I-STAT ARTERIAL BLOOD GAS, ED    EKG  EKG Interpretation  Date/Time:  Thursday February 02 2017 10:04:47 EDT Ventricular Rate:  103 PR Interval:    QRS Duration: 131 QT Interval:  389 QTC Calculation: 510 R Axis:   -63 Text Interpretation:  Sinus tachycardia Ventricular premature complex Nonspecific IVCD with LAD Left ventricular hypertrophy Nonspecific T abnormalities, lateral leads ST elevation, consider inferior injury Baseline wander in lead(s) II aVR aVF similar to previous EKG aside from tachycardia  Confirmed by Crista CurbLiu, Donnice Nielsen [phone]([id number]) on 02/02/2017 10:36:55 AM       Radiology Dg Chest 2 View  Result Date: 02/02/2017 CLINICAL DATA:  Increasing shortness of breath for 4 days. EXAM: CHEST  2 VIEW COMPARISON:  04/12/2012 FINDINGS: Cardiomegaly, with especially prominent left ventricle. Vascular prominence at the hila with some cephalized blood flow but no Kerley lines or pleural effusion. Stable aortic contours. Chronic mild generalized interstitial coarsening. Lung volumes are large. EKG leads create artifact over the chest. IMPRESSION: 1. Cardiomegaly that  has likely progressed from 2013. 2. History of COPD with chronic interstitial coarsening. Electronically Signed   By: Marnee SpringJonathon  Watts M.D.   On: 02/02/2017 10:23    Procedures Procedures (including critical care time) CRITICAL CARE Performed by: Lavera Guiseana Duo Emary Zalar   Total critical care time: 40 minutes  Critical care time was exclusive of separately billable procedures and treating other patients.  Critical care was necessary to treat or prevent imminent or life-threatening deterioration.  Critical care was time spent personally by me on the following activities: development of treatment plan with patient and/or surrogate as well as nursing, discussions with consultants, evaluation of patient's response to treatment, examination of patient, obtaining history from patient or surrogate, ordering and performing treatments and interventions, ordering and review of laboratory studies, ordering and review of radiographic studies, pulse oximetry and re-evaluation of patient's condition.  Medications Ordered in ED Medications  albuterol (PROVENTIL) (2.5 MG/3ML) 0.083% nebulizer solution 5 mg (  5 mg Nebulization Not Given 02/02/17 1042)  ipratropium (ATROVENT) nebulizer solution 0.5 mg (0.5 mg Nebulization Not Given 02/02/17 1042)  ipratropium-albuterol (DUONEB) 0.5-2.5 (3) MG/3ML nebulizer solution 3 mL (3 mLs Nebulization Given 02/02/17 1159)  albuterol (PROVENTIL) (2.5 MG/3ML) 0.083% nebulizer solution 5 mg (5 mg Nebulization Given 02/02/17 1000)  albuterol (PROVENTIL) (2.5 MG/3ML) 0.083% nebulizer solution (5 mg  Given 02/02/17 1029)  methylPREDNISolone sodium succinate (SOLU-MEDROL) 125 mg/2 mL injection 125 mg (125 mg Intravenous Given 02/02/17 1044)  azithromycin (ZITHROMAX) 500 mg in dextrose 5 % 250 mL IVPB (500 mg Intravenous New Bag/Given 02/02/17 1051)  albuterol (PROVENTIL,VENTOLIN) solution continuous neb (10 mg/hr Nebulization Given 02/02/17 1207)     Initial Impression / Assessment and Plan / ED Course   I have reviewed the triage vital signs and the nursing notes.  Pertinent labs & imaging results that were available during my care of the patient were reviewed by me and considered in my medical decision making (see chart for details).     Patient is DNR. Also currently refusing BiPAP. Also refusing face mask for continuous albuterol. Does not do well just with just recurrent breathing treatments and Solu-Medrol. Appears diaphoretic, tripoding, hypoxia on 3L. Subsequently accepted BiPAP, and ultimately appears more comfortable and not hypoxic on BiPAP. Receiving continuous albuterol with BiPAP. Chest x-ray without edema or infiltrate but does suggest cardiomegaly. Troponin is normal and EKG is not ischemic. The patient's daughter does report less palliative care consult which is placed. Discussed with hospitalist service will admit to stepdown for ongoing treatment.  Final Clinical Impressions(s) / ED Diagnoses   Final diagnoses:  Acute respiratory failure with hypoxia (HCC)  Acute exacerbation of COPD with asthma [facility])    New Prescriptions New Prescriptions   No medications on file     Lavera Guise, MD 02/02/17 1235

## 2017-02-02 NOTE — H&P (Signed)
History and Physical:    Warren White   UVO:[id number] DOB: 08-Jan-1926 DOA: 02/02/2017  Referring MD/provider: Dr Verdie Mosher PCP: Patient, No Pcp Per   Patient coming from: Home  Chief Complaint: Worsening dyspnea on exertion for the past week  History of Present Illness:   Warren White is an 83 y.o. male with a past medical history significant for COPD who generally avoids medical care and has declined use of inhalers in the past who was in his usual state of health until 2-3 weeks prior to admission when he noted increasing dyspnea on exertion. At baseline patient swims 9 laps a couple of times a week and plays 9 holes of golf albeit slowly. He is able to go grocery shopping and does not use a motorized cart when he does so. Patient does not have home oxygen. 2-3 weeks ago patient noted increasing shortness of breath during these activities. He did go to his PCP who gave him a nebulizer which he accepted for the first time. He initially felt better using the nebulizer however over the past week he has felt increasingly short of breath again. He is now so short of breath that he is unable to walk to the bathroom in his house.  Patient admits to cough productive of clear phlegm at baseline. He thinks he may be producing more phlegm since he started using the nebulizer but he has not sure. He does not think his cough is worse. No change in color of phlegm. No fevers or chills.  ED Course:  The patient was noted to be very short of breath upon presentation to the emergency room with O2 sats of 85% on room air. Patient initially declined albuterol nebulizer due to claustrophobia however subsequently did agree to use it. He was however becoming increasingly fatigued so he agreed to use of BiPAP. Patient tolerated BiPAP well and said he felt better. Patient has been getting albuterol and DuoNeb's along with oxygen via the BiPAP.  ROS:   ROS patient denies any chest pain or pleuritic chest pain, no  abdominal pain nausea vomiting or diarrhea, no fevers or chills or malaise. Patient does admit to lower urinary tract symptoms including incomplete voiding, frequency and difficulty maintaining a forceful stream. No dysuria  Past Medical History:   Past Medical History:  Diagnosis Date  . COPD (chronic obstructive pulmonary disease) (HCC)   . Emphysema of lung (HCC)   . GERD (gastroesophageal reflux disease)   . Psoriasis   . Restless leg     Past Surgical History:   Past Surgical History:  Procedure Laterality Date  . BACK SURGERY    . CHOLECYSTECTOMY    . ROTATOR CUFF REPAIR Bilateral   . TOTAL HIP ARTHROPLASTY Left     Social History:   Social History   Social History  . Marital status: Married    Spouse name: N/A  . Number of children: N/A  . Years of education: N/A   Occupational History  . Not on file.   Social History Main Topics  . Smoking status: Current Every Day Smoker    Types: Pipe  . Smokeless tobacco: Never Used  . Alcohol use 7.0 oz/week    14 Standard drinks or equivalent per week  . Drug use: No  . Sexual activity: No   Other Topics Concern  . Not on file   Social History Narrative  . No narrative on file    Allergies   Patient has no known allergies.  Family history:   History reviewed. No pertinent family history.  Current Medications:   Prior to Admission medications   Medication Sig Start Date End Date Taking? Authorizing Provider  cholecalciferol (VITAMIN D) 1000 units tablet Take 1,000 Units by mouth every Monday, Wednesday, and Friday.   Yes [provider]  Cyanocobalamin (VITAMIN B-12 PO) Take 1 tablet by mouth every Monday, Wednesday, and Friday.   Yes [provider]  Ferrous Sulfate (IRON) 325 (65 Fe) MG TABS Take 1 tablet by mouth every Monday, Wednesday, and Friday.   Yes [provider]  glucosamine-chondroitin 500-400 MG tablet Take 3 tablets by mouth every Monday, Wednesday, and Friday.    Yes [provider]  ipratropium-albuterol (DUONEB) 0.5-2.5 (3) MG/3ML SOLN Take 3 mLs by nebulization every 6 (six) hours as needed (for shortness of breath or wheezing).   Yes [provider]  MAGNESIUM PO Take 1 tablet by mouth every Monday, Wednesday, and Friday.   Yes [provider]  methylPREDNISolone (MEDROL) 4 MG tablet Take 1-3 mg by mouth See admin instructions. Started 08/02 for 15 days  Takes 15mg  for first 5 days, then 10mg  for 5 days, then 5mg  for 5 days   Yes [provider]  Multiple Vitamin (MULTIVITAMIN WITH MINERALS) TABS tablet Take 1 tablet by mouth every Monday, Wednesday, and Friday.   Yes [provider]  naproxen sodium (ANAPROX) 220 MG tablet Take 440 mg by mouth 2 (two) times a week.    Yes [provider]  Omega-3 Fatty Acids (FISH OIL) 1000 MG CAPS Take 1 capsule by mouth every Monday, Wednesday, and Friday.   Yes [provider]  pantoprazole (PROTONIX) 40 MG tablet Take 40 mg by mouth every evening.  08/03/16  Yes [provider]  pramipexole (MIRAPEX) 0.5 MG tablet Take 0.25 mg by mouth 2 (two) times daily. Takes twice a day but can take another 0.25 mg if he needs it 08/29/16  Yes [provider]  traZODone (DESYREL) 100 MG tablet Take 200 mg by mouth at bedtime.   Yes [provider]  vitamin C (ASCORBIC ACID) 500 MG tablet Take 500 mg by mouth every Monday, Wednesday, and Friday.   Yes [provider]  meclizine (ANTIVERT) 25 MG tablet Take 1 tablet (25 mg total) by mouth 3 (three) times daily as needed for dizziness. Patient not taking: Reported on 02/02/2017 10/20/16   Derwood Kaplan, MD    Physical Exam:   Vitals:   02/02/17 1130 02/02/17 1200 02/02/17 1201 02/02/17 1208  BP: 135/82 138/82 (!) 138/92   Pulse: 99 97 96   Resp: (!) 32 (!) 32 (!) 30   Temp:      TempSrc:      SpO2: (!) 89% 94% 95% 97%     Physical Exam: Blood pressure (!) 138/92, pulse 96,  temperature 98.2 F (36.8 C), temperature source Oral, resp. rate (!) 30, SpO2 97 %. Gen: Patient sitting on side of bed on bipap, able to speak in full sentences with me through bipap without evidence of fatigue.  Head: Normocephalic, atraumatic. Eyes: Sclerae nonicteric, conjunctiva mildly injected bilaterally Neck: brawny Chest: moderate air entry bilaterally with scattered diffuse high pitched wheezing  CV: Distant, regular  Abdomen: Obese, protuberant, soft, NT Extremities: 2+ edema in LE bilaterally to mid calf  Skin: Warm and dry. No rashes, lesions or wounds. Neuro: Alert and oriented times 3; grossly nonfocal.  Psych: Insight is good and judgment is appropriate. Mood and affect  normal.   Data Review:    Labs: Basic Metabolic Panel:  Recent Labs Lab 02/02/17 1021  NA 142  K 4.4  CL 105  CO2 29  GLUCOSE 153*  BUN 19  CREATININE 0.82  CALCIUM 8.8*   Liver Function Tests: No results for input(s): AST, ALT, ALKPHOS, BILITOT, PROT, ALBUMIN in the last 168 hours. No results for input(s): LIPASE, AMYLASE in the last 168 hours. No results for input(s): AMMONIA in the last 168 hours. CBC:  Recent Labs Lab 02/02/17 1021  WBC 10.2  HGB 17.0  HCT 51.4  MCV 92.6  PLT 222   Cardiac Enzymes: No results for input(s): CKTOTAL, CKMB, CKMBINDEX, TROPONINI in the last 168 hours.  BNP (last 3 results) No results for input(s): PROBNP in the last 8760 hours. CBG: No results for input(s): GLUCAP in the last 168 hours.  Urinalysis No results found for: COLORURINE, APPEARANCEUR, LABSPEC, PHURINE, GLUCOSEU, HGBUR, BILIRUBINUR, KETONESUR, PROTEINUR, UROBILINOGEN, NITRITE, LEUKOCYTESUR    Radiographic Studies: Dg Chest 2 View  Result Date: 02/02/2017 CLINICAL DATA:  Increasing shortness of breath for 4 days. EXAM: CHEST  2 VIEW COMPARISON:  04/12/2012 FINDINGS: Cardiomegaly, with especially prominent left ventricle. Vascular prominence at the hila with some cephalized blood  flow but no Kerley lines or pleural effusion. Stable aortic contours. Chronic mild generalized interstitial coarsening. Lung volumes are large. EKG leads create artifact over the chest. IMPRESSION: 1. Cardiomegaly that has likely progressed from 2013. 2. History of COPD with chronic interstitial coarsening. Electronically Signed   By: Marnee SpringJonathon  Watts M.D.   On: 02/02/2017 10:23    EKG: Independently reviewed. Wandering baseline, NSR at 100, +LAD, + LVH, Q in 3,F and Q V1-V2   Assessment/Plan:   Active Problems:   COPD with acute exacerbation (HCC)   RLS (restless legs syndrome)   HIV screening The patient falls between the ages of 13-64 and should be screened for HIV, therefore HIV testing ordered.  There is no height or weight on file to calculate BMI.  Other information:   DVT prophylaxis: Lovenox ordered. Code Status: Full code. Family Communication: Daughter and son in law provided some of the history and were in the room throughout the visit.  Disposition Plan: Home Consults called: None Admission status: Inpatient, step down  COPD Patient is willing to stay in the hospital overnight on BiPAP. Patient states he is finding the BiPAP helpful. He feels like he can speak in full sentences and is less short of breath. Solu-Medrol followed by prednisone, azithromycin and inhaled bronchodilators are ordered. Chest x-ray is negative for parenchymal disease.  CARDIOMEGALY Patient admits to untreated hypertension. We discussed the cardiomegaly seen on chest x-ray. He is willing to undergo an echocardiogram if it might help suggest medications which might help his pedal edema. Patient does not want to take any medications unless it has direct effects on his quality of life. Patient denies any history of coronary artery disease or cardiomyopathy.  HTN Patient admits to untreated hypertension. Does not want to start treatment right now. Might consider it in the future depending upon the  results of the echocardiogram.  RLS It is very important for patient to continue his Mirapex as his restless leg syndrome is very uncomfortable to him. He takes 1 tablet at 5 PM and one tablet at 9 PM. It's very important to him to continue on this particular regimen.  BPV Noted In use when necessary meclizine if need arises.  Not presently symptomatic    The  medical decision making on this patient was of high complexity and the patient is at high risk for clinical deterioration, therefore this is a level 3 visit.   Horatio Pel Orma Flaming [facility] Pager [phone] Cell: [phone]   If 7PM-7AM, please contact night-coverage [email or web address] Password [email or web address] 02/02/2017, 1:42 PM

## 2017-02-02 NOTE — Progress Notes (Signed)
Pt sitting on the side of the bed on 4 liters nasal cannula using urinal, pt started to sweat and O2 sats dropped to 81%. Gave pt nebulizer treatment and called respiratory and pt placed back on bipap

## 2017-02-02 NOTE — ED Notes (Addendum)
Respiratory called to start continuous neb and bipap, per Dr. Verdie MosherLiu. Pt 85% on 3L. Family reports patient fell asleep. Patient now 91% sitting up on side of bed, on 5L per Dr. Verdie MosherLiu.

## 2017-02-03 ENCOUNTER — Inpatient Hospital Stay (HOSPITAL_COMMUNITY): Payer: Medicare Other

## 2017-02-03 DIAGNOSIS — I340 Nonrheumatic mitral (valve) insufficiency: Secondary | ICD-10-CM

## 2017-02-03 DIAGNOSIS — J441 Chronic obstructive pulmonary disease with (acute) exacerbation: Secondary | ICD-10-CM

## 2017-02-03 DIAGNOSIS — J45901 Unspecified asthma with (acute) exacerbation: Secondary | ICD-10-CM

## 2017-02-03 DIAGNOSIS — Z7189 Other specified counseling: Secondary | ICD-10-CM

## 2017-02-03 DIAGNOSIS — R0600 Dyspnea, unspecified: Secondary | ICD-10-CM | POA: Diagnosis present

## 2017-02-03 DIAGNOSIS — R0609 Other forms of dyspnea: Secondary | ICD-10-CM

## 2017-02-03 DIAGNOSIS — Z515 Encounter for palliative care: Secondary | ICD-10-CM

## 2017-02-03 DIAGNOSIS — G2581 Restless legs syndrome: Secondary | ICD-10-CM

## 2017-02-03 DIAGNOSIS — I517 Cardiomegaly: Secondary | ICD-10-CM

## 2017-02-03 LAB — CBC
HEMATOCRIT: 48.9 % (ref 39.0–52.0)
HEMOGLOBIN: 15.6 g/dL (ref 13.0–17.0)
MCH: 29.1 pg (ref 26.0–34.0)
MCHC: 31.9 g/dL (ref 30.0–36.0)
MCV: 91.2 fL (ref 78.0–100.0)
Platelets: 184 10*3/uL (ref 150–400)
RBC: 5.36 MIL/uL (ref 4.22–5.81)
RDW: 15.6 % — AB (ref 11.5–15.5)
WBC: 8.1 10*3/uL (ref 4.0–10.5)

## 2017-02-03 LAB — ECHOCARDIOGRAM COMPLETE
Height: 68 in
Weight: 3178.15 oz

## 2017-02-03 LAB — BASIC METABOLIC PANEL
ANION GAP: 11 (ref 5–15)
BUN: 23 mg/dL — AB (ref 6–20)
CHLORIDE: 102 mmol/L (ref 101–111)
CO2: 28 mmol/L (ref 22–32)
Calcium: 8.7 mg/dL — ABNORMAL LOW (ref 8.9–10.3)
Creatinine, Ser: 0.73 mg/dL (ref 0.61–1.24)
GFR calc Af Amer: >60 mL/min (ref >60)
GFR calc non Af Amer: >60 mL/min (ref >60)
Glucose, Bld: 130 mg/dL — ABNORMAL HIGH (ref 65–99)
POTASSIUM: 4.5 mmol/L (ref 3.5–5.1)
SODIUM: 141 mmol/L (ref 135–145)

## 2017-02-03 MED ORDER — MAGNESIUM 200 MG PO TABS: ORAL_TABLET | ORAL | Status: DC

## 2017-02-03 MED ORDER — OMEGA-3-ACID ETHYL ESTERS 1 G PO CAPS
1.0000 g | ORAL_CAPSULE | ORAL | Status: DC
  Administered 2017-02-03 – 2017-02-06 (×2): 1 g via ORAL
  Filled 2017-02-03 (×2): qty 1

## 2017-02-03 MED ORDER — PRAMIPEXOLE DIHYDROCHLORIDE 0.25 MG PO TABS
0.2500 mg | ORAL_TABLET | ORAL | Status: DC
  Administered 2017-02-03 – 2017-02-06 (×4): 0.25 mg via ORAL
  Filled 2017-02-03 (×5): qty 1

## 2017-02-03 MED ORDER — VITAMIN D3 25 MCG (1000 UNIT) PO TABS
1000.0000 [IU] | ORAL_TABLET | ORAL | Status: DC
  Administered 2017-02-03 – 2017-02-06 (×2): 1000 [IU] via ORAL
  Filled 2017-02-03 (×2): qty 1

## 2017-02-03 MED ORDER — MAGNESIUM OXIDE 400 (241.3 MG) MG PO TABS
200.0000 mg | ORAL_TABLET | ORAL | Status: DC
  Administered 2017-02-03 – 2017-02-06 (×2): 200 mg via ORAL
  Filled 2017-02-03 (×2): qty 1

## 2017-02-03 MED ORDER — ZOLPIDEM TARTRATE 5 MG PO TABS
5.0000 mg | ORAL_TABLET | Freq: Every evening | ORAL | Status: DC | PRN
  Administered 2017-02-03: 5 mg via ORAL
  Filled 2017-02-03: qty 1

## 2017-02-03 MED ORDER — FERROUS SULFATE 325 (65 FE) MG PO TABS
325.0000 mg | ORAL_TABLET | ORAL | Status: DC
  Administered 2017-02-03 – 2017-02-06 (×2): 325 mg via ORAL
  Filled 2017-02-03 (×2): qty 1

## 2017-02-03 MED ORDER — TRAZODONE HCL 100 MG PO TABS
200.0000 mg | ORAL_TABLET | Freq: Every day | ORAL | Status: DC
  Administered 2017-02-03 – 2017-02-06 (×4): 200 mg via ORAL
  Filled 2017-02-03: qty 2
  Filled 2017-02-03 (×2): qty 4
  Filled 2017-02-03: qty 2

## 2017-02-03 MED ORDER — PRAMIPEXOLE DIHYDROCHLORIDE 0.25 MG PO TABS
0.2500 mg | ORAL_TABLET | ORAL | Status: DC
  Administered 2017-02-03 – 2017-02-06 (×4): 0.25 mg via ORAL
  Filled 2017-02-03 (×4): qty 1

## 2017-02-03 MED ORDER — IRON 325 (65 FE) MG PO TABS: 1.0000 | ORAL_TABLET | ORAL | Status: DC

## 2017-02-03 MED ORDER — ADULT MULTIVITAMIN W/MINERALS CH
1.0000 | ORAL_TABLET | ORAL | Status: DC
  Administered 2017-02-03 – 2017-02-06 (×2): 1 via ORAL
  Filled 2017-02-03 (×2): qty 1

## 2017-02-03 MED ORDER — VITAMIN C 500 MG PO TABS
500.0000 mg | ORAL_TABLET | ORAL | Status: DC
  Administered 2017-02-03 – 2017-02-06 (×2): 500 mg via ORAL
  Filled 2017-02-03 (×2): qty 1

## 2017-02-03 MED ORDER — GLUCOSAMINE-CHONDROITIN 500-400 MG PO TABS: 3.0000 | ORAL_TABLET | ORAL | Status: DC

## 2017-02-03 MED ORDER — HYDRALAZINE HCL 20 MG/ML IJ SOLN
10.0000 mg | INTRAMUSCULAR | Status: DC | PRN
  Administered 2017-02-03: 10 mg via INTRAVENOUS
  Filled 2017-02-03: qty 1
  Filled 2017-02-03: qty 0.5

## 2017-02-03 MED ORDER — VITAMIN B-12 100 MCG PO TABS
100.0000 ug | ORAL_TABLET | ORAL | Status: DC
  Administered 2017-02-03 – 2017-02-06 (×2): 100 ug via ORAL
  Filled 2017-02-03 (×2): qty 1

## 2017-02-03 NOTE — Progress Notes (Signed)
Pt off BIPAP at this time. No distress noted. 

## 2017-02-03 NOTE — Progress Notes (Signed)
  Echocardiogram 2D Echocardiogram has been performed.  Warren PartridgeBrooke S Stone White 02/03/2017, 4:02 PM

## 2017-02-03 NOTE — Care Management Note (Signed)
Case Management Note  Patient Details  Name: Warren White MRN: [id number] Date of Birth: [birth year]  Subjective/Objective:      resp distress requiring bipap              Action/Plan: Date:  February 03, 2017 Chart reviewed for concurrent status and case management needs. Will continue to follow patient progress. Discharge Planning: following for needs Expected discharge date: [id number] Marcelle SmilingRhonda Davis, BSN, [place]3, [place]   [phone]  Expected Discharge Date:   (unknown)               Expected Discharge Plan:  Home/Self Care  In-House Referral:     Discharge planning Services  CM Consult  Post Acute Care Choice:    Choice offered to:     DME Arranged:    DME Agency:     HH Arranged:    HH Agency:     Status of Service:  In process, will continue to follow  If discussed at Long Length of Stay Meetings, dates discussed:    Additional Comments:  Golda AcreDavis, Rhonda Lynn, RN 02/03/2017, 8:25 AM

## 2017-02-03 NOTE — Progress Notes (Addendum)
PROGRESS NOTE  Warren White  [id number] DOB: Apr 15, 1926 DOA: 02/02/2017 PCP: Patient, No Pcp Per   Brief Narrative: Warren White is an 83 y.o. male with a history of COPD who generally avoids medical care and presented to the ED 8/9 with worsening 2-3 weeks of dyspnea worse on exertion. He swims three times a week and golfs weekly at baseline, but these have been more difficult. He went to his PCP and was started on a nebulizer which helped. Of note, he attempted to qualify for home oxygen at that visit but maintained SpO2 >90% on exertion. He reported increased sputum, though still clear, with no change in cough. On arrival he had increased work of breathing with SpO2 85% on room air. Nebulizers and antibiotics were provided, though he required BiPAP for respiratory distress. He was admitted to SDU and continued to need BiPAP intermittently.   Assessment & Plan: Active Problems:   Acute exacerbation of COPD with asthma (HCC)   RLS (restless legs syndrome)   Dyspnea   Encounter for palliative care   Goals of care, counseling/discussion  Acute hypoxic respiratory failure: Due to COPD with exacerbation and possibly acute CHF. - Appreciate palliative care team assistance, confirmed pt DNR.  - Continue BiPAP prn and supplemental oxygen. - No IVF's  COPD with acute exacerbation: No infiltrate on CXR - Continue steroids, azithromycin, scheduled and prn nebulizers.   Cardiomegaly: Suspect he's had untreated HTN for a long time. No history of CAD or recent chest pain. ECG largely unchanged from prior with LVH and no ischemic changes (though read as inferior ST elevation, I do not agree). BNP elevated to 363.3.  - Echocardiogram today. Pt willing to take medications if indicated to help treat heart failure. Patient does not want to take any medications unless it has direct effects on his quality of life.  HTN:  - Currently refusing treatment. Not severe range currently.  RLS: Chronic,  severe.  - Ordered home regimen of mirapex at 5:15pm and 9:15pm daily  History of BPPV: Asymptomatic currently  DVT prophylaxis: Lovenox Code Status: DNR Family Communication: None at bedside this AM Disposition Plan: Continue SDU for need of BiPAP  Consultants:   Palliative care team  Procedures:   BiPAP prn 8/9 - 8/10  Antimicrobials:  Azithromycin 8/9 >>   Subjective: Reports he feels better and wants to go home, but also admits that he still gets fatigued with increased work of breathing. He has had lower extremity edema for years, unchanged.   Objective: Vitals:   02/03/17 1000 02/03/17 1056 02/03/17 1200 02/03/17 1600  BP: (!) 149/80  (!) 152/92 (!) 163/78  Pulse:      Resp: (!) 22 (!) 34 16 (!) 27  Temp:   98.3 F (36.8 C)   TempSrc:   Oral   SpO2: (!) 89% 96% 95% 91%  Weight:      Height:        Intake/Output Summary (Last 24 hours) at 02/03/17 1704 Last data filed at 02/03/17 1200  Gross per 24 hour  Intake              480 ml  Output              600 ml  Net             -120 ml   Filed Weights   02/02/17 1435  Weight: 90.1 kg (198 lb 10.2 oz)    Examination: General exam: Tired but well-nourished male in no  distress Respiratory system: Tripod position, prolonged expiration and increased WOB. Bilateral wheezing without significant crackles.  Cardiovascular system: Regular rate and rhythm. No murmur, rub, or gallop. No JVD, and 2+ symmetric pedal edema. Gastrointestinal system: Abdomen soft, non-tender, non-distended, with normoactive bowel sounds. No organomegaly or masses felt. Central nervous system: Alert and oriented. No focal neurological deficits. Extremities: Warm, no deformities Skin: Venous stasis changes in bilateral ankles, otherwise no rashes, lesions no ulcers Psychiatry: Judgement and insight appear normal. Mood & affect appropriate.   Data Reviewed: I have personally reviewed following labs and imaging studies  CBC:  Recent  Labs Lab 02/02/17 1021 02/03/17 0239  WBC 10.2 8.1  HGB 17.0 15.6  HCT 51.4 48.9  MCV 92.6 91.2  PLT 222 184   Basic Metabolic Panel:  Recent Labs Lab 02/02/17 1021 02/03/17 0239  NA 142 141  K 4.4 4.5  CL 105 102  CO2 29 28  GLUCOSE 153* 130*  BUN 19 23*  CREATININE 0.82 0.73  CALCIUM 8.8* 8.7*   GFR: Estimated Creatinine Clearance: 68.3 mL/min (by C-G formula based on SCr of 0.73 mg/dL). Liver Function Tests: No results for input(s): AST, ALT, ALKPHOS, BILITOT, PROT, ALBUMIN in the last 168 hours. No results for input(s): LIPASE, AMYLASE in the last 168 hours. No results for input(s): AMMONIA in the last 168 hours. Coagulation Profile: No results for input(s): INR, PROTIME in the last 168 hours. Cardiac Enzymes: No results for input(s): CKTOTAL, CKMB, CKMBINDEX, TROPONINI in the last 168 hours. BNP (last 3 results) No results for input(s): PROBNP in the last 8760 hours. HbA1C: No results for input(s): HGBA1C in the last 72 hours. CBG: No results for input(s): GLUCAP in the last 168 hours. Lipid Profile: No results for input(s): CHOL, HDL, LDLCALC, TRIG, CHOLHDL, LDLDIRECT in the last 72 hours. Thyroid Function Tests: No results for input(s): TSH, T4TOTAL, FREET4, T3FREE, THYROIDAB in the last 72 hours. Anemia Panel: No results for input(s): VITAMINB12, FOLATE, FERRITIN, TIBC, IRON, RETICCTPCT in the last 72 hours. Urine analysis: No results found for: COLORURINE, APPEARANCEUR, LABSPEC, PHURINE, GLUCOSEU, HGBUR, BILIRUBINUR, KETONESUR, PROTEINUR, UROBILINOGEN, NITRITE, LEUKOCYTESUR Recent Results (from the past 240 hour(s))  MRSA PCR Screening     Status: None   Collection Time: 02/02/17  2:37 PM  Result Value Ref Range Status   MRSA by PCR NEGATIVE NEGATIVE Final    Comment:        The GeneXpert MRSA Assay (FDA approved for NASAL specimens only), is one component of a comprehensive MRSA colonization surveillance program. It is not intended to diagnose  MRSA infection nor to guide or monitor treatment for MRSA infections.       Radiology Studies: Dg Chest 2 View  Result Date: 02/02/2017 CLINICAL DATA:  Increasing shortness of breath for 4 days. EXAM: CHEST  2 VIEW COMPARISON:  04/12/2012 FINDINGS: Cardiomegaly, with especially prominent left ventricle. Vascular prominence at the hila with some cephalized blood flow but no Kerley lines or pleural effusion. Stable aortic contours. Chronic mild generalized interstitial coarsening. Lung volumes are large. EKG leads create artifact over the chest. IMPRESSION: 1. Cardiomegaly that has likely progressed from 2013. 2. History of COPD with chronic interstitial coarsening. Electronically Signed   By: Marnee Spring M.D.   On: 02/02/2017 10:23    Scheduled Meds: . albuterol  5 mg Nebulization Once  . aspirin  325 mg Oral Daily  . azithromycin  250 mg Oral Daily  . cholecalciferol  1,000 Units Oral Q M,W,F  . enoxaparin (  LOVENOX) injection  40 mg Subcutaneous Q24H  . ferrous sulfate  325 mg Oral Q M,W,F  . ipratropium  0.5 mg Nebulization Once  . ipratropium-albuterol  3 mL Nebulization Q6H  . magnesium oxide  200 mg Oral Q M,W,F  . mouth rinse  15 mL Mouth Rinse BID  . multivitamin with minerals  1 tablet Oral Q M,W,F  . naproxen  250 mg Oral BID WC  . nicotine  21 mg Transdermal Daily  . omega-3 acid ethyl esters  1 g Oral Q M,W,F  . pantoprazole  40 mg Oral Daily  . pramipexole  0.25 mg Oral Q24H  . pramipexole  0.25 mg Oral Q24H  . predniSONE  40 mg Oral Q breakfast  . sodium chloride flush  3 mL Intravenous Q12H  . traZODone  200 mg Oral QHS  . vitamin B-12  100 mcg Oral Q M,W,F  . vitamin C  500 mg Oral Q M,W,F   Continuous Infusions: . sodium chloride       LOS: 1 day   Time spent: 35 minutes.  Hazeline Junker, MD [facility] Pager [phone]  If 7PM-7AM, please contact night-coverage [email or web address] Password [email or web address] 02/03/2017, 5:04 PM

## 2017-02-03 NOTE — Progress Notes (Signed)
PHARMACIST - PHYSICIAN ORDER COMMUNICATION  CONCERNING: P&T Medication Policy on Herbal Medications  DESCRIPTION:  This patient's order for:  Glucosamine/chondroitin  has been noted.  This product(s) is classified as an "herbal" or natural product. Due to a lack of definitive safety studies or FDA approval, nonstandard manufacturing practices, plus the potential risk of unknown drug-drug interactions while on inpatient medications, the Pharmacy and Therapeutics Committee does not permit the use of "herbal" or natural products of this type within [facility].   ACTION TAKEN: The pharmacy department is unable to verify this order at this time and your patient has been informed of this safety policy. Please reevaluate patient's clinical condition at discharge and address if the herbal or natural product(s) should be resumed at that time.  Dorna LeitzAnh Kahlil Cowans, PharmD, BCPS 02/03/2017 9:16 AM

## 2017-02-03 NOTE — Progress Notes (Signed)
Placed on BiPAP at this time per pt request

## 2017-02-03 NOTE — Consult Note (Signed)
Consultation Note Date: 02/03/2017   Patient Name: Warren White  DOB: 21-Sep-1925  MRN: [id number]  Age / Sex: 83 y.o., male  PCP: Patient, No Pcp Per Referring Physician: Tyrone Nine, MD  Reason for Consultation: Establishing goals of care  HPI/Patient Profile: 83 y.o. male  with past medical history of  COPD, not on much medical treatment admitted on 02/02/2017 with COPD exacerbation, untreated HTN.    Clinical Assessment and Goals of Care:  83 yo gentleman admitted with worsening dyspnea, initially required BIPAP, also with bilateral lower extremity edema noted. He is currently in step down unit. It is noted that the patient did not want to come into the hospital, a palliative consult has been placed for goals of care discussions.   I find Warren White sitting up in bed, by the side of the bed, there is no family present at the bedside. He is in mild resp distress, he has pursed lip breathing, he states this is pretty much his baseline.   He lives at home, he is currently living alone, as his wife has had a stroke and she is in rehab currently. We talked about whether he would consider going to rehab, participating in physical therapy. Patient states he does not want PT, does not want SNF, not even for a rehab attempt. He simply wants to go home as soon as possible.   Patient complains that he was not given his trazodone last night, he did not rest well.   I did introduce myself and palliative care as follows: Palliative medicine is specialized medical care for people living with serious illness. It focuses on providing relief from the symptoms and stress of a serious illness. The goal is to improve quality of life for both the patient and the family.  We discussed about his symptom burden, the serious irreversible nature of his COPD. See further discussions below, thank you for the consult.   NEXT OF KIN  wife, who is currently in rehab after she had a stroke recently.  Has a daughter who lives locally, reportedly comes in and checks up on him.    SUMMARY OF RECOMMENDATIONS    Agree with DNR DNI.  Discussed with patient about COPD trajectory, concern for underlying CHF with workup underway with ECHO, also discussed about goals/wishes and values:  Patient states he is very clear about what he wants, he does not like coming into hospitals, wants to go back home as soon as possible, he does not think he needs home hospice services, he is not sure if he would want to follow up with pulmonary specialists in the outpatient setting. "Look, I'm 83 years old, I know what I have will not get better. I just want to be home."  Recommend home with home health care, continue to follow disease trajectory to help guide further decision making. No immediate palliative interventions/recommendations at this time.    Code Status/Advance Care Planning:  DNR    Symptom Management:    as above   Palliative  Prophylaxis:   Delirium Protocol  Psycho-social/Spiritual:   Desire for further Chaplaincy support:no  Additional Recommendations: Education on Hospice  Prognosis:   < 12 months  Discharge Planning: Home with Home Health      Primary Diagnoses: Present on Admission: . COPD with acute exacerbation (HCC) . RLS (restless legs syndrome)   I have reviewed the medical record, interviewed the patient and family, and examined the patient. The following aspects are pertinent.  Past Medical History:  Diagnosis Date  . COPD (chronic obstructive pulmonary disease) (HCC)   . Emphysema of lung (HCC)   . GERD (gastroesophageal reflux disease)   . Psoriasis   . Restless leg    Social History   Social History  . Marital status: Married    Spouse name: N/A  . Number of children: N/A  . Years of education: N/A   Social History Main Topics  . Smoking status: Current Every Day Smoker    Types:  Pipe  . Smokeless tobacco: Never Used  . Alcohol use 7.0 oz/week    14 Standard drinks or equivalent per week  . Drug use: No  . Sexual activity: No   Other Topics Concern  . None   Social History Narrative  . None   History reviewed. No pertinent family history. Scheduled Meds: . albuterol  5 mg Nebulization Once  . aspirin  325 mg Oral Daily  . azithromycin  250 mg Oral Daily  . cholecalciferol  1,000 Units Oral Q M,W,F  . enoxaparin (LOVENOX) injection  40 mg Subcutaneous Q24H  . ferrous sulfate  325 mg Oral Q M,W,F  . ipratropium  0.5 mg Nebulization Once  . ipratropium-albuterol  3 mL Nebulization Q6H  . magnesium oxide  200 mg Oral Q M,W,F  . mouth rinse  15 mL Mouth Rinse BID  . multivitamin with minerals  1 tablet Oral Q M,W,F  . naproxen  250 mg Oral BID WC  . nicotine  21 mg Transdermal Daily  . omega-3 acid ethyl esters  1 g Oral Q M,W,F  . pantoprazole  40 mg Oral Daily  . pramipexole  0.25 mg Oral BID  . predniSONE  40 mg Oral Q breakfast  . sodium chloride flush  3 mL Intravenous Q12H  . traZODone  200 mg Oral QHS  . vitamin B-12  100 mcg Oral Q M,W,F  . vitamin C  500 mg Oral Q M,W,F   Continuous Infusions: . sodium chloride     PRN Meds:.sodium chloride, acetaminophen **OR** acetaminophen, hydrALAZINE, ipratropium-albuterol, meclizine, polyethylene glycol, sodium chloride flush, zolpidem Medications Prior to Admission:  Prior to Admission medications   Medication Sig Start Date End Date Taking? Authorizing Provider  cholecalciferol (VITAMIN D) 1000 units tablet Take 1,000 Units by mouth every Monday, Wednesday, and Friday.   Yes [provider]  Cyanocobalamin (VITAMIN B-12 PO) Take 1 tablet by mouth every Monday, Wednesday, and Friday.   Yes [provider]  Ferrous Sulfate (IRON) 325 (65 Fe) MG TABS Take 1 tablet by mouth every Monday, Wednesday, and Friday.   Yes [provider]  glucosamine-chondroitin 500-400 MG tablet  Take 3 tablets by mouth every Monday, Wednesday, and Friday.   Yes [provider]  ipratropium-albuterol (DUONEB) 0.5-2.5 (3) MG/3ML SOLN Take 3 mLs by nebulization every 6 (six) hours as needed (for shortness of breath or wheezing).   Yes [provider]  MAGNESIUM PO Take 1 tablet by mouth every Monday, Wednesday, and Friday.   Yes [provider]  methylPREDNISolone (MEDROL) 4 MG tablet Take 1-3 mg by mouth See admin instructions. Started 08/02 for 15 days  Takes 15mg  for first 5 days, then 10mg  for 5 days, then 5mg  for 5 days   Yes [provider]  Multiple Vitamin (MULTIVITAMIN WITH MINERALS) TABS tablet Take 1 tablet by mouth every Monday, Wednesday, and Friday.   Yes [provider]  naproxen sodium (ANAPROX) 220 MG tablet Take 440 mg by mouth 2 (two) times a week.    Yes [provider]  Omega-3 Fatty Acids (FISH OIL) 1000 MG CAPS Take 1 capsule by mouth every Monday, Wednesday, and Friday.   Yes [provider]  pantoprazole (PROTONIX) 40 MG tablet Take 40 mg by mouth every evening.  08/03/16  Yes [provider]  pramipexole (MIRAPEX) 0.5 MG tablet Take 0.25 mg by mouth 2 (two) times daily. Takes twice a day but can take another 0.25 mg if he needs it 08/29/16  Yes [provider]  traZODone (DESYREL) 100 MG tablet Take 200 mg by mouth at bedtime.   Yes [provider]  vitamin C (ASCORBIC ACID) 500 MG tablet Take 500 mg by mouth every Monday, Wednesday, and Friday.   Yes [provider]  meclizine (ANTIVERT) 25 MG tablet Take 1 tablet (25 mg total) by mouth 3 (three) times daily as needed for dizziness. Patient not taking: Reported on 02/02/2017 10/20/16   Derwood Kaplan, MD   No Known Allergies Review of Systems +chronic dyspnea  Physical Exam Sitting by edge of bed Scattered wheezes Abdomen soft Has 2 + LE edema Awake alert oriented Answers all questions appropriately S1 S2  Vital  Signs: BP (!) 151/64 (BP Location: Left Arm)   Pulse 94   Temp 97.8 F (36.6 C) (Oral)   Resp (!) 29   Ht 5\' 8"  (1.727 m)   Wt 90.1 kg (198 lb 10.2 oz)   SpO2 94%   BMI 30.20 kg/m  Pain Assessment: No/denies pain POSS *See Group Information*: 1-Acceptable,Awake and alert     SpO2: SpO2: 94 % O2 Device:SpO2: 94 % O2 Flow Rate: .O2 Flow Rate (L/min): 5 L/min  IO: Intake/output summary:  Intake/Output Summary (Last 24 hours) at 02/03/17 1053 Last data filed at 02/03/17 0900  Gross per 24 hour  Intake              480 ml  Output              600 ml  Net             -120 ml    LBM: Last BM Date: 02/01/17 Baseline Weight: Weight: 90.1 kg (198 lb 10.2 oz) Most recent weight: Weight: 90.1 kg (198 lb 10.2 oz)     Palliative Assessment/Data:   Flowsheet Rows     Most Recent Value  Intake Tab  Referral Department  Hospitalist  Unit at Time of Referral  Intermediate Care Unit  Palliative Care Primary Diagnosis  Pulmonary  Palliative Care Type  New Palliative care  Reason for referral  Clarify Goals of Care  Date first seen by Palliative Care  02/03/17  Clinical Assessment  Palliative Performance Scale Score  30%  Pain Max last 24 hours  4  Pain Min Last 24 hours  3  Dyspnea Max Last 24 Hours  5  Dyspnea Min Last 24 hours  4  Psychosocial & Spiritual Assessment  Palliative Care Outcomes  Patient/Family meeting held?  Yes  Who was  at the meeting?  patient who is decisional.   Palliative Care Outcomes  Provided advance care planning      Time In:  9.50 Time Out:  10.50 Time Total:  60 min  Greater than 50%  of this time was spent counseling and coordinating care related to the above assessment and plan.  Signed by: Rosalin HawkingZeba Angelena Sand, MD  [phone]  Please contact Palliative Medicine Team phone at [phone] for questions and concerns.  For individual provider: See Loretha StaplerAmion

## 2017-02-04 DIAGNOSIS — I5021 Acute systolic (congestive) heart failure: Secondary | ICD-10-CM

## 2017-02-04 DIAGNOSIS — J9691 Respiratory failure, unspecified with hypoxia: Secondary | ICD-10-CM | POA: Diagnosis present

## 2017-02-04 DIAGNOSIS — I5032 Chronic diastolic (congestive) heart failure: Secondary | ICD-10-CM | POA: Diagnosis present

## 2017-02-04 MED ORDER — LISINOPRIL 2.5 MG PO TABS
5.0000 mg | ORAL_TABLET | Freq: Every day | ORAL | Status: DC
  Administered 2017-02-04: 5 mg via ORAL
  Filled 2017-02-04: qty 2

## 2017-02-04 MED ORDER — LOSARTAN POTASSIUM 50 MG PO TABS
50.0000 mg | ORAL_TABLET | Freq: Every day | ORAL | Status: DC
  Administered 2017-02-05 – 2017-02-07 (×3): 50 mg via ORAL
  Filled 2017-02-04 (×3): qty 1

## 2017-02-04 MED ORDER — SPIRONOLACTONE 25 MG PO TABS
25.0000 mg | ORAL_TABLET | Freq: Every day | ORAL | Status: DC
  Administered 2017-02-04 – 2017-02-07 (×4): 25 mg via ORAL
  Filled 2017-02-04 (×4): qty 1

## 2017-02-04 MED ORDER — METOPROLOL SUCCINATE ER 25 MG PO TB24
12.5000 mg | ORAL_TABLET | Freq: Every day | ORAL | Status: DC
  Administered 2017-02-04 – 2017-02-07 (×4): 12.5 mg via ORAL
  Filled 2017-02-04 (×4): qty 1

## 2017-02-04 MED ORDER — FUROSEMIDE 10 MG/ML IJ SOLN
40.0000 mg | Freq: Two times a day (BID) | INTRAMUSCULAR | Status: DC
  Administered 2017-02-04 – 2017-02-06 (×5): 40 mg via INTRAVENOUS
  Filled 2017-02-04 (×5): qty 4

## 2017-02-04 MED ORDER — NAPROXEN 250 MG PO TABS
250.0000 mg | ORAL_TABLET | Freq: Two times a day (BID) | ORAL | Status: DC | PRN
  Filled 2017-02-04: qty 1

## 2017-02-04 NOTE — Consult Note (Signed)
Reason for Consult: Acute systolic congestive heart failure Referring Physician: [facility]  Warren White is an 83 y.o. male.  HPI: Patient is 83-year-old male with past medical history significant for COPD, history of tobacco abuse smoked cigars for 50+ years, and GERD, restless leg syndrome, psoriasis, was admitted on 02/03/1999 in because of progressive increasing shortness of breath for last 5-7 days with minimal exertion. Patient denies any chest pain. Denies history of PND orthopnea but complains of chronic leg swelling. patient denies any chest pain nausea vomiting diaphoresis. Patient complains of cough with whitish phlegm and denies any fever or chills. Patient was treated for exacerbation of COPD workup suggested cardiomegaly requiring 2-D echo which showed global hypokinesia with EF 35-40% with systolic and diastolic dysfunction. Patient denies any invasive workup in the past states wants is to be treated medically and eager to go home. EKG done in the ED showed a sinus tachycardia with left axis deviation, nonspecific IVCD, LVH and nonspecific ST-T wave changes. And was noted to have mildly elevated BNP.  Past Medical History:  Diagnosis Date  . COPD (chronic obstructive pulmonary disease) (HCC)   . Emphysema of lung (HCC)   . GERD (gastroesophageal reflux disease)   . Psoriasis   . Restless leg     Past Surgical History:  Procedure Laterality Date  . BACK SURGERY    . CHOLECYSTECTOMY    . ROTATOR CUFF REPAIR Bilateral   . TOTAL HIP ARTHROPLASTY Left     History reviewed. No pertinent family history.  Social History:  reports that he has been smoking Pipe.  He has never used smokeless tobacco. He reports that he drinks about 7.0 oz of alcohol per week . He reports that he does not use drugs.  Allergies: No Known Allergies  Medications: I have reviewed the patient's current medications.  Results for orders placed or performed during the hospital encounter of 02/02/17  (from the past 48 hour(s))  MRSA PCR Screening     Status: None   Collection Time: 02/02/17  2:37 PM  Result Value Ref Range   MRSA by PCR NEGATIVE NEGATIVE    Comment:        The GeneXpert MRSA Assay (FDA approved for NASAL specimens only), is one component of a comprehensive MRSA colonization surveillance program. It is not intended to diagnose MRSA infection nor to guide or monitor treatment for MRSA infections.   Basic metabolic panel     Status: Abnormal   Collection Time: 02/03/17  2:39 AM  Result Value Ref Range   Sodium 141 135 - 145 mmol/L   Potassium 4.5 3.5 - 5.1 mmol/L   Chloride 102 101 - 111 mmol/L   CO2 28 22 - 32 mmol/L   Glucose, Bld 130 (H) 65 - 99 mg/dL   BUN 23 (H) 6 - 20 mg/dL   Creatinine, Ser 0.73 0.61 - 1.24 mg/dL   Calcium 8.7 (L) 8.9 - 10.3 mg/dL   GFR calc non Af Amer >60 >60 mL/min   GFR calc Af Amer >60 >60 mL/min    Comment: (NOTE) The eGFR has been calculated using the CKD EPI equation. This calculation has not been validated in all clinical situations. eGFR's persistently <60 mL/min signify possible Chronic Kidney Disease.    Anion gap 11 5 - 15  CBC     Status: Abnormal   Collection Time: 02/03/17  2:39 AM  Result Value Ref Range   WBC 8.1 4.0 - 10.5 K/uL   RBC 5.36 4.22 -   5.81 MIL/uL   Hemoglobin 15.6 13.0 - 17.0 g/dL   HCT 48.9 39.0 - 52.0 %   MCV 91.2 78.0 - 100.0 fL   MCH 29.1 26.0 - 34.0 pg   MCHC 31.9 30.0 - 36.0 g/dL   RDW 15.6 (H) 11.5 - 15.5 %   Platelets 184 150 - 400 K/uL    No results found.  Review of Systems  Constitutional: Negative for chills and fever.  HENT: Negative for hearing loss.   Eyes: Negative for blurred vision.  Respiratory: Positive for cough, sputum production, shortness of breath and wheezing.   Cardiovascular: Positive for leg swelling. Negative for chest pain.  Gastrointestinal: Negative for abdominal pain and vomiting.  Genitourinary: Negative for dysuria.  Neurological: Positive for  dizziness.   Blood pressure (!) 155/65, pulse 94, temperature 98.1 F (36.7 C), temperature source Oral, resp. rate (!) 30, height 5' 8" (1.727 m), weight 88.7 kg (195 lb 8.8 oz), SpO2 94 %. Physical Exam  Constitutional: He is oriented to person, place, and time.  HENT:  Head: Normocephalic and atraumatic.  Eyes: Conjunctivae are normal. Left eye exhibits no discharge. No scleral icterus.  Neck: Normal range of motion. Neck supple. No tracheal deviation present. No thyromegaly present.  Cardiovascular: Normal rate and regular rhythm.   Murmur (Soft systolic murmur and S3 gallop noted) heard. Respiratory:  Bilateral rhonchi and rales and wheezing noted  GI: Soft. Bowel sounds are normal.  Musculoskeletal:  No clubbing cyanosis 1+ edema noted  Neurological: He is alert and oriented to person, place, and time.    Assessment/Plan: Acute on chronic systolic/diastolic congestive heart failure rule out ischemia Acute exacerbation of COPD Acute hypoxic respiratory failure secondary to above Hypertension Restless leg syndrome History of tobacco abuse Plan Change lisinopril to losartan in view of significant COPD/coughing Start Aldactone as per orders agree with IV Lasix low-dose beta blockers Discussed with patient various options of treatment wants to be treated medically and is eager to go home. Strict I and O's and dailyweight Warren, White 02/04/2017, 11:13 AM     

## 2017-02-04 NOTE — Progress Notes (Signed)
PROGRESS NOTE  Warren BellowsWouter Oberry  ZOX:[id number] DOB: 22-Apr-1926 DOA: 02/02/2017 PCP: Patient, No Pcp Per   Brief Narrative: Warren White is an 83 y.o. male with a history of COPD who generally avoids medical care and presented to the ED 8/9 with worsening 2-3 weeks of dyspnea worse on exertion. He swims three times a week and golfs weekly at baseline, but these have been more difficult. He went to his PCP and was started on a nebulizer which helped. Of note, he attempted to qualify for home oxygen at that visit but maintained SpO2 >90% on exertion. He reported increased sputum, though still clear, with no change in cough. On arrival he had increased work of breathing with SpO2 85% on room air. Nebulizers and antibiotics were provided, though he required BiPAP for respiratory distress. He was admitted to SDU and continued to need BiPAP intermittently. Echocardiogram showed EF 35-40% and diuresis was started. BP medications have been started.  Assessment & Plan: Principal Problem:   Acute respiratory failure with hypoxia (HCC) Active Problems:   Acute exacerbation of COPD with asthma (HCC)   BPV (benign positional vertigo)   RLS (restless legs syndrome)   GERD (gastroesophageal reflux disease)   BPH (benign prostatic hyperplasia)   Dyspnea   Encounter for palliative care   Goals of care, counseling/discussion   Acute systolic CHF (congestive heart failure) (HCC)  Acute hypoxic respiratory failure: Due to COPD with exacerbation and possibly acute CHF. - Appreciate palliative care team assistance, confirmed pt DNR. Continues to want an active life and medications geared only toward quality of life.  - Continue BiPAP prn and supplemental oxygen.  COPD with acute exacerbation: No infiltrate on CXR.  - Continue steroids, azithromycin, scheduled and prn nebulizers.   Acute systolic CHF: Suspect due to long-term untreated HTN. No h/o CAD or chest pain. ECG largely unchanged from prior with LVH and  no ischemic changes (though read as inferior ST elevation, I do not agree). BNP elevated to 363.3 and cardiomegaly on CXR.  - Start lasix 40mg  IV BID - Start low dose metoprolol and lisinopril - Strict I/O, daily weights - Patient does not want to take any medications unless it has direct effects on his quality of life.  HTN:  - Starting metoprolol and lisinopril, continue prn IV   RLS: Chronic, severe.  - Ordered home regimen of mirapex at 5:15pm and 9:15pm daily  History of BPPV: Asymptomatic currently  DVT prophylaxis: Lovenox Code Status: DNR Family Communication: None at bedside this AM Disposition Plan: Continue SDU for need of BiPAP  Consultants:   Palliative care team  Procedures:   BiPAP prn 8/9 - 8/10  Antimicrobials:  Azithromycin 8/9 >>   Subjective: Reports he feels a little better and wants to go home. Again, he has continued to require BiPAP intermittently and increased work of breathing which he says is worse than his baseline, but it wounds like his baseline is also pretty bad. No chest pain. He has had lower extremity edema for years, unchanged.   Objective: Vitals:   02/04/17 0400 02/04/17 0600 02/04/17 0740 02/04/17 0830  BP: (!) 144/82 (!) 141/89    Pulse:      Resp: (!) 22 (!) 27    Temp:   98.1 F (36.7 C)   TempSrc:   Oral   SpO2: 99% 98%  94%  Weight:      Height:        Intake/Output Summary (Last 24 hours) at 02/04/17 0848 Last data  filed at 02/04/17 0219  Gross per 24 hour  Intake              360 ml  Output             1050 ml  Net             -690 ml   Filed Weights   02/02/17 1435  Weight: 90.1 kg (198 lb 10.2 oz)   General exam: Tired but well-nourished male in no acute distress Respiratory system: Tripod position, prolonged expiration and increased WOB. Bilateral wheezing with scant bibasilar crackles.  Cardiovascular system: Regular rate and rhythm. No murmur, rub, or gallop. No JVD, 2+ symmetric pedal  edema. Gastrointestinal system: Abdomen soft, non-tender, non-distended, with normoactive bowel sounds. No organomegaly or masses felt. Central nervous system: Alert and oriented. No focal neurological deficits. Extremities: Feet and legs cold without hair growth.  Skin: Venous stasis changes in bilateral ankles, otherwise no rashes, lesions no ulcers Psychiatry: Judgement and insight appear normal. Mood & affect appropriate.   Data Reviewed: I have personally reviewed following labs and imaging studies  CBC:  Recent Labs Lab 02/02/17 1021 02/03/17 0239  WBC 10.2 8.1  HGB 17.0 15.6  HCT 51.4 48.9  MCV 92.6 91.2  PLT 222 184   Basic Metabolic Panel:  Recent Labs Lab 02/02/17 1021 02/03/17 0239  NA 142 141  K 4.4 4.5  CL 105 102  CO2 29 28  GLUCOSE 153* 130*  BUN 19 23*  CREATININE 0.82 0.73  CALCIUM 8.8* 8.7*   GFR: Estimated Creatinine Clearance: 68.3 mL/min (by C-G formula based on SCr of 0.73 mg/dL).  Radiology Studies: Dg Chest 2 View  Result Date: 02/02/2017 CLINICAL DATA:  Increasing shortness of breath for 4 days. EXAM: CHEST  2 VIEW COMPARISON:  04/12/2012 FINDINGS: Cardiomegaly, with especially prominent left ventricle. Vascular prominence at the hila with some cephalized blood flow but no Kerley lines or pleural effusion. Stable aortic contours. Chronic mild generalized interstitial coarsening. Lung volumes are large. EKG leads create artifact over the chest. IMPRESSION: 1. Cardiomegaly that has likely progressed from 2013. 2. History of COPD with chronic interstitial coarsening. Electronically Signed   By: Marnee Spring M.D.   On: 02/02/2017 10:23    Scheduled Meds: . albuterol  5 mg Nebulization Once  . aspirin  325 mg Oral Daily  . azithromycin  250 mg Oral Daily  . cholecalciferol  1,000 Units Oral Q M,W,F  . enoxaparin (LOVENOX) injection  40 mg Subcutaneous Q24H  . ferrous sulfate  325 mg Oral Q M,W,F  . furosemide  40 mg Intravenous BID  .  ipratropium-albuterol  3 mL Nebulization Q6H  . lisinopril  5 mg Oral Daily  . magnesium oxide  200 mg Oral Q M,W,F  . mouth rinse  15 mL Mouth Rinse BID  . metoprolol succinate  12.5 mg Oral Daily  . multivitamin with minerals  1 tablet Oral Q M,W,F  . nicotine  21 mg Transdermal Daily  . omega-3 acid ethyl esters  1 g Oral Q M,W,F  . pantoprazole  40 mg Oral Daily  . pramipexole  0.25 mg Oral Q24H  . pramipexole  0.25 mg Oral Q24H  . predniSONE  40 mg Oral Q breakfast  . sodium chloride flush  3 mL Intravenous Q12H  . traZODone  200 mg Oral QHS  . vitamin B-12  100 mcg Oral Q M,W,F  . vitamin C  500 mg Oral Q M,W,F  Continuous Infusions: . sodium chloride       LOS: 2 days   Time spent: 35 minutes.  Hazeline Junker, MD [facility] Pager [phone]  If 7PM-7AM, please contact night-coverage [email or web address] Password [facility] 02/04/2017, 8:48 AM

## 2017-02-05 DIAGNOSIS — N401 Enlarged prostate with lower urinary tract symptoms: Secondary | ICD-10-CM

## 2017-02-05 DIAGNOSIS — K219 Gastro-esophageal reflux disease without esophagitis: Secondary | ICD-10-CM

## 2017-02-05 LAB — BASIC METABOLIC PANEL
Anion gap: 9 (ref 5–15)
BUN: 30 mg/dL — AB (ref 6–20)
CO2: 35 mmol/L — ABNORMAL HIGH (ref 22–32)
Calcium: 8.5 mg/dL — ABNORMAL LOW (ref 8.9–10.3)
Chloride: 100 mmol/L — ABNORMAL LOW (ref 101–111)
Creatinine, Ser: 0.79 mg/dL (ref 0.61–1.24)
GFR calc Af Amer: >60 mL/min (ref >60)
GLUCOSE: 108 mg/dL — AB (ref 65–99)
POTASSIUM: 4.2 mmol/L (ref 3.5–5.1)
Sodium: 144 mmol/L (ref 135–145)

## 2017-02-05 NOTE — Progress Notes (Signed)
Subjective:  Patient denies any chest pain states breathing has improved diuresing well.  Objective:  Vital Signs in the last 24 hours: Temp:  [96.6 F (35.9 C)-98 F (36.7 C)] 96.6 F (35.9 C) (08/12 0800) Pulse Rate:  [94] 94 (08/11 1105) Resp:  [18-35] 26 (08/12 0600) BP: (110-158)/(57-99) 158/70 (08/12 0600) SpO2:  [88 %-97 %] 95 % (08/12 0600) Weight:  [88.7 kg (195 lb 8.8 oz)] 88.7 kg (195 lb 8.8 oz) (08/11 1058)  Intake/Output from previous day: 08/11 0701 - 08/12 0700 In: 120 [P.O.:120] Out: 2225 [Urine:2225] Intake/Output from this shift: No intake/output data recorded.  Physical Exam: Neck: no adenopathy, no carotid bruit, no JVD and supple, symmetrical, trachea midline Lungs: Bilateral expiratory wheezing with faint rhonchi and rales noted air entry has improved Heart: regular rate and rhythm, S1, S2 normal and Soft systolic murmur and S3 gallop noted Abdomen: soft, non-tender; bowel sounds normal; no masses,  no organomegaly Extremities: No clubbing cyanosis 1+ edema noted  Lab Results:  Recent Labs  02/02/17 1021 02/03/17 0239  WBC 10.2 8.1  HGB 17.0 15.6  PLT 222 184    Recent Labs  02/03/17 0239 02/05/17 0334  NA 141 144  K 4.5 4.2  CL 102 100*  CO2 28 35*  GLUCOSE 130* 108*  BUN 23* 30*  CREATININE 0.73 0.79   No results for input(s): TROPONINI in the last 72 hours.  Invalid input(s): CK, MB Hepatic Function Panel No results for input(s): PROT, ALBUMIN, AST, ALT, ALKPHOS, BILITOT, BILIDIR, IBILI in the last 72 hours. No results for input(s): CHOL in the last 72 hours. No results for input(s): PROTIME in the last 72 hours.  Imaging: Imaging results have been reviewed and No results found.  Cardiac Studies:  Assessment/Plan:  Resolving Acute on chronic systolic/diastolic congestive heart failure rule out ischemia Acute exacerbation of COPD Acute hypoxic respiratory failure secondary to above Hypertension Restless leg  syndrome History of tobacco abuse Plan Continue present management Discussed with patient and her daughter regarding what his options of treatment and wanted to be treated medically.  LOS: 3 days    Warren White, Warren White 02/05/2017, 9:31 AM

## 2017-02-05 NOTE — Progress Notes (Signed)
PROGRESS NOTE  Warren BellowsWouter Spera  ZOX:[id number] DOB: Oct 25, 1925 DOA: 02/02/2017 PCP: Patient, No Pcp Per   Brief Narrative: Warren White is an 83 y.o. male with a history of COPD who generally avoids medical care and presented to the ED 8/9 with worsening 2-3 weeks of dyspnea worse on exertion. He swims three times a week and golfs weekly at baseline, but these have been more difficult. He went to his PCP and was started on a nebulizer which helped. Of note, he attempted to qualify for home oxygen at that visit but maintained SpO2 >90% on exertion. He reported increased sputum, though still clear, with no change in cough. On arrival he had increased work of breathing with SpO2 85% on room air. Nebulizers and antibiotics were provided, though he required BiPAP for respiratory distress. He was admitted to SDU and improved slowly. Echocardiogram showed EF 35-40% and diuresis was started with good effect. He has been adamant about going home, stating his breathing is back to his baseline, though he still demonstrates increased work of breathing and hypoxia at rest.   Assessment & Plan: Principal Problem:   Acute respiratory failure with hypoxia (HCC) Active Problems:   Acute exacerbation of COPD with asthma (HCC)   BPV (benign positional vertigo)   RLS (restless legs syndrome)   GERD (gastroesophageal reflux disease)   BPH (benign prostatic hyperplasia)   Dyspnea   Encounter for palliative care   Goals of care, counseling/discussion   Acute systolic CHF (congestive heart failure) (HCC)  Acute hypoxic respiratory failure: Due to COPD with exacerbation and acute CHF. - Appreciate palliative care team assistance, confirmed pt DNR. Pt also declining BiPAP at this time. Continues to want an active life and medications geared only toward quality of life.  - Continue supplemental oxygen, will ambulate prior to DC to qualify for home O2.  COPD with acute exacerbation: No infiltrate on CXR.  - Continue  steroids, azithromycin, scheduled and prn nebulizers. Still having wheezing, suspect additional controller medications could offer improvement. Will discuss with pulmonology.   Acute systolic CHF: Suspect due to long-term untreated HTN. No h/o CAD or chest pain. ECG largely unchanged from prior with LVH and no ischemic changes (though read as inferior ST elevation, I do not agree). BNP elevated to 363.3 and cardiomegaly on CXR.  - Started lasix 40mg  IV BID with 2.2L UOP, weight down 3lbs, and stable creatinine. Continue this x24hrs as pt will not stay longer than that.  - Started losartan, metoprolol, spironolactone. Will need follow up within the week to recheck labs and vitals.  - Strict I/O, daily weights  HTN:  - Started metoprolol and losartan, continue prn IV. May need dose titration.  RLS: Chronic, severe.  - Ordered home regimen of mirapex at 5:15pm and 9:15pm daily  History of BPPV: Asymptomatic currently  DVT prophylaxis: Lovenox Code Status: DNR Family Communication: Daughter and SIL at bedside. Disposition Plan: Pt refuses BiPAP and will only stay in the hospital for 1 more night regardless of medical recommendations. Will transfer to floor.   Consultants:   Palliative care team  Cardiology, Dr. Sharyn LullHarwani  Pulmonology  Procedures:   BiPAP prn 8/9 - 8/10  Antimicrobials:  Azithromycin 8/9 >>   Subjective: His breathing is back to his baseline and he wants to go home. He started to wear BiPAP yesterday and said he got claustrophobic so declines this even with trial of ativan. He is not sleeping well, restless legs flaring, strongly desires to go home. Lives alone  while his wife is in rehab for hemiparesis following a stroke. No chest pain. He has had lower extremity edema for years, unchanged.   Objective: Vitals:   02/05/17 0600 02/05/17 0800 02/05/17 1026 02/05/17 1031  BP: (!) 158/70  (!) 158/93   Pulse:   93   Resp: (!) 26     Temp:  (!) 96.6 F (35.9 C)     TempSrc:  Axillary    SpO2: 95%     Weight:    88.5 kg (195 lb 1.7 oz)  Height:        Intake/Output Summary (Last 24 hours) at 02/05/17 1115 Last data filed at 02/05/17 4098  Gross per 24 hour  Intake              120 ml  Output             2500 ml  Net            -2380 ml   Filed Weights   02/02/17 1435 02/04/17 1058 02/05/17 1031  Weight: 90.1 kg (198 lb 10.2 oz) 88.7 kg (195 lb 8.8 oz) 88.5 kg (195 lb 1.7 oz)   General exam: Well-nourished male in no acute distress Respiratory system: Labored WOB which he states is better than baseline. Bilateral wheezing with scant bibasilar crackles.  Cardiovascular system: Regular rate and rhythm. No murmur, rub, or gallop. No JVD, 1+ symmetric pedal edema. Gastrointestinal system: Abdomen soft, non-tender, non-distended, with normoactive bowel sounds. No organomegaly or masses felt. Central nervous system: Alert and oriented. No focal neurological deficits. Extremities: Feet and legs cold without hair growth.  Skin: Venous stasis changes in bilateral ankles, otherwise no rashes, lesions no ulcers Psychiatry: Judgement and insight appear normal. Mood & affect appropriate.   Data Reviewed: I have personally reviewed following labs and imaging studies  CBC:  Recent Labs Lab 02/02/17 1021 02/03/17 0239  WBC 10.2 8.1  HGB 17.0 15.6  HCT 51.4 48.9  MCV 92.6 91.2  PLT 222 184   Basic Metabolic Panel:  Recent Labs Lab 02/02/17 1021 02/03/17 0239 02/05/17 0334  NA 142 141 144  K 4.4 4.5 4.2  CL 105 102 100*  CO2 29 28 35*  GLUCOSE 153* 130* 108*  BUN 19 23* 30*  CREATININE 0.82 0.73 0.79  CALCIUM 8.8* 8.7* 8.5*   GFR: Estimated Creatinine Clearance: 67.6 mL/min (by C-G formula based on SCr of 0.79 mg/dL).  Radiology Studies: No results found.  Scheduled Meds: . albuterol  5 mg Nebulization Once  . aspirin  325 mg Oral Daily  . azithromycin  250 mg Oral Daily  . cholecalciferol  1,000 Units Oral Q M,W,F  . enoxaparin  (LOVENOX) injection  40 mg Subcutaneous Q24H  . ferrous sulfate  325 mg Oral Q M,W,F  . furosemide  40 mg Intravenous BID  . ipratropium-albuterol  3 mL Nebulization Q6H  . losartan  50 mg Oral Daily  . magnesium oxide  200 mg Oral Q M,W,F  . mouth rinse  15 mL Mouth Rinse BID  . metoprolol succinate  12.5 mg Oral Daily  . multivitamin with minerals  1 tablet Oral Q M,W,F  . nicotine  21 mg Transdermal Daily  . omega-3 acid ethyl esters  1 g Oral Q M,W,F  . pantoprazole  40 mg Oral Daily  . pramipexole  0.25 mg Oral Q24H  . pramipexole  0.25 mg Oral Q24H  . predniSONE  40 mg Oral Q breakfast  . sodium chloride  flush  3 mL Intravenous Q12H  . spironolactone  25 mg Oral Daily  . traZODone  200 mg Oral QHS  . vitamin B-12  100 mcg Oral Q M,W,F  . vitamin C  500 mg Oral Q M,W,F   Continuous Infusions: . sodium chloride       LOS: 3 days   Time spent: 35 minutes.  Hazeline Junker, MD [facility] Pager [phone]  If 7PM-7AM, please contact night-coverage [email or web address] Password [facility] 02/05/2017, 11:15 AM

## 2017-02-06 DIAGNOSIS — J9601 Acute respiratory failure with hypoxia: Secondary | ICD-10-CM

## 2017-02-06 LAB — BASIC METABOLIC PANEL
Anion gap: 6 (ref 5–15)
BUN: 30 mg/dL — ABNORMAL HIGH (ref 6–20)
CALCIUM: 8.6 mg/dL — AB (ref 8.9–10.3)
CO2: 41 mmol/L — ABNORMAL HIGH (ref 22–32)
CREATININE: 0.87 mg/dL (ref 0.61–1.24)
Chloride: 97 mmol/L — ABNORMAL LOW (ref 101–111)
GFR calc non Af Amer: >60 mL/min (ref >60)
Glucose, Bld: 107 mg/dL — ABNORMAL HIGH (ref 65–99)
Potassium: 3.7 mmol/L (ref 3.5–5.1)
SODIUM: 144 mmol/L (ref 135–145)

## 2017-02-06 MED ORDER — FUROSEMIDE 40 MG PO TABS
40.0000 mg | ORAL_TABLET | Freq: Two times a day (BID) | ORAL | Status: DC
  Administered 2017-02-06 – 2017-02-07 (×3): 40 mg via ORAL
  Filled 2017-02-06 (×2): qty 1

## 2017-02-06 MED ORDER — GUAIFENESIN ER 600 MG PO TB12
1200.0000 mg | ORAL_TABLET | Freq: Two times a day (BID) | ORAL | Status: DC
  Administered 2017-02-06 – 2017-02-07 (×3): 1200 mg via ORAL
  Filled 2017-02-06 (×3): qty 2

## 2017-02-06 MED ORDER — UMECLIDINIUM-VILANTEROL 62.5-25 MCG/INH IN AEPB
1.0000 | INHALATION_SPRAY | Freq: Every day | RESPIRATORY_TRACT | Status: DC
  Administered 2017-02-06 – 2017-02-07 (×2): 1 via RESPIRATORY_TRACT
  Filled 2017-02-06: qty 14

## 2017-02-06 MED ORDER — BUDESONIDE 0.25 MG/2ML IN SUSP
RESPIRATORY_TRACT | Status: AC
  Administered 2017-02-06: 09:00:00
  Filled 2017-02-06: qty 2

## 2017-02-06 NOTE — Progress Notes (Signed)
Subjective:  Patient denies any chest pain breathing slowly improving diuresing well  Objective:  Vital Signs in the last 24 hours: Temp:  [97.7 F (36.5 C)-98.5 F (36.9 C)] 98.5 F (36.9 C) (08/13 1308) Pulse Rate:  [80-95] 95 (08/13 1308) Resp:  [18-21] 20 (08/13 1308) BP: (137-139)/(65-106) 139/106 (08/13 1308) SpO2:  [90 %-99 %] 96 % (08/13 1415) Weight:  [84.7 kg (186 lb 11.7 oz)] 84.7 kg (186 lb 11.7 oz) (08/13 0555)  Intake/Output from previous day: 08/12 0701 - 08/13 0700 In: -  Out: 1725 [Urine:1725] Intake/Output from this shift: Total I/O In: 920 [P.O.:920] Out: 550 [Urine:550]  Physical Exam: Neck: no adenopathy, no carotid bruit, no JVD and supple, symmetrical, trachea midline Lungs: Bilateral wheezing and rhonchi noted Heart: regular rate and rhythm, S1, S2 normal and Soft systolic murmur noted Abdomen: soft, non-tender; bowel sounds normal; no masses,  no organomegaly Extremities: No clubbing cyanosis 1+ edema noted  Lab Results: No results for input(s): WBC, HGB, PLT in the last 72 hours.  Recent Labs  02/05/17 0334 02/06/17 0437  NA 144 144  K 4.2 3.7  CL 100* 97*  CO2 35* 41*  GLUCOSE 108* 107*  BUN 30* 30*  CREATININE 0.79 0.87   No results for input(s): TROPONINI in the last 72 hours.  Invalid input(s): CK, MB Hepatic Function Panel No results for input(s): PROT, ALBUMIN, AST, ALT, ALKPHOS, BILITOT, BILIDIR, IBILI in the last 72 hours. No results for input(s): CHOL in the last 72 hours. No results for input(s): PROTIME in the last 72 hours.  Imaging: Imaging results have been reviewed and No results found.  Cardiac Studies:  Assessment/Plan:  Resolving Acute on chronic systolic/diastolic congestive heart failure rule out ischemia Acute exacerbation of COPD Acute hypoxic respiratory failure secondary to above Hypertension Restless leg syndrome History of tobacco abuse Plan Continue present management Restrict fluid to 1 L per 24  hours Strict I and O's and daily weight  Check labs in a.m.  LOS: 4 days    Rinaldo CloudHarwani, Kyeshia Zinn 02/06/2017, 5:51 PM

## 2017-02-06 NOTE — Progress Notes (Signed)
PROGRESS NOTE  Warren White  ZOX:[id number] DOB: August 17, 1925 DOA: 02/02/2017 PCP: Patient, No Pcp Per   Brief Narrative: Warren White is an 83 y.o. male with a history of COPD who generally avoids medical care and presented to the ED 8/9 with worsening 2-3 weeks of dyspnea worse on exertion. He swims three times a week and golfs weekly at baseline, but these have been more difficult. He went to his PCP and was started on a nebulizer which helped. Of note, he attempted to qualify for home oxygen at that visit but maintained SpO2 >90% on exertion. He reported increased sputum, though still clear, with no change in cough. On arrival he had increased work of breathing with SpO2 85% on room air. Nebulizers and antibiotics were provided, though he required BiPAP for respiratory distress. He was admitted to SDU and improved slowly. Echocardiogram showed EF 35-40% and diuresis was started with good effect. His wheezing still seems to be uncontrolled, so pulmonology is consulted.   Assessment & Plan: Principal Problem:   Acute respiratory failure with hypoxia (HCC) Active Problems:   Acute exacerbation of COPD with asthma (HCC)   BPV (benign positional vertigo)   RLS (restless legs syndrome)   GERD (gastroesophageal reflux disease)   BPH (benign prostatic hyperplasia)   Dyspnea   Encounter for palliative care   Goals of care, counseling/discussion   Acute systolic CHF (congestive heart failure) (HCC)  Acute hypoxic respiratory failure: Due to COPD with exacerbation and acute CHF. - Appreciate palliative care team assistance, confirmed pt DNR. Pt also declining BiPAP at this time. Continues to want an active life and medications geared only toward quality of life.  - Continue supplemental oxygen, will ambulate prior to DC to qualify for home O2.  COPD with acute exacerbation: No infiltrate on CXR.  - Continue steroids, azithromycin, scheduled and prn nebulizers. Still having wheezing, suspect  additional controller medications could offer improvement. Will discuss with pulmonology.   Acute systolic CHF: Suspect due to long-term untreated HTN. No h/o CAD or chest pain. ECG largely unchanged from prior with LVH and no ischemic changes (though read as inferior ST elevation, I do not agree). BNP elevated to 363.3 and cardiomegaly on CXR.  - Started lasix 40mg  IV BID with good UOP, weight down. Creatinine mildly up with elevated BUN:Cr and evidence of contraction alkalosis. Will transition to po and monitor.   - Started losartan, metoprolol, spironolactone. Will need follow up within the week to recheck labs and vitals.  - Strict I/O, daily weights.  HTN:  - Started metoprolol and losartan, continue prn IV. May need dose titration.  RLS: Chronic, severe.  - Ordered home regimen of mirapex at 5:15pm and 9:15pm daily  History of BPPV: Asymptomatic currently  DVT prophylaxis: Lovenox Code Status: DNR Family Communication: Daughter at bedside. Disposition Plan: Therapy evaluation, pulmonology consults pending. Anticipate DC in 24-48hrs, will quantify oxygen requirement prior to DC.   Consultants:   Palliative care team  Cardiology, Dr. Sharyn Lull  Pulmonology  Procedures:   BiPAP prn 8/9 - 8/10  Antimicrobials:  Azithromycin 8/9 >>   Subjective: Breathing is better, swelling much better. He didn't think the swelling could ever get better. He slept much better last night. Still constantly wheezing. Pt's subjective report of dyspnea is incongruent/minimized from his daughter's report of his chronic work of breathing.   Objective: Vitals:   02/05/17 2105 02/05/17 2306 02/06/17 0409 02/06/17 0555  BP: 138/65   137/77  Pulse: 80   83  Resp: 18   (!) 21  Temp: 97.7 F (36.5 C)   98.2 F (36.8 C)  TempSrc: Oral   Oral  SpO2: 90% 91% 90% 99%  Weight:    84.7 kg (186 lb 11.7 oz)  Height:        Intake/Output Summary (Last 24 hours) at 02/06/17 1113 Last data filed at  02/06/17 0900  Gross per 24 hour  Intake              440 ml  Output             1150 ml  Net             -710 ml   Filed Weights   02/04/17 1058 02/05/17 1031 02/06/17 0555  Weight: 88.7 kg (195 lb 8.8 oz) 88.5 kg (195 lb 1.7 oz) 84.7 kg (186 lb 11.7 oz)   General exam: Well-nourished, elderly male in no acute distress Respiratory system: Labored on supplemental oxygen at rest. Rhoncorous expiratory wounds diffusely. No crackles.  Cardiovascular system: Regular rate and rhythm. No murmur, rub, or gallop. No JVD, trace symmetric pedal edema. Gastrointestinal system: Abdomen soft, non-tender, non-distended, with normoactive bowel sounds. No organomegaly or masses felt. Central nervous system: Alert and oriented. No focal neurological deficits. Extremities: Feet and legs cold without hair growth.  Skin: Venous stasis changes in bilateral ankles, otherwise no rashes, lesions no ulcers Psychiatry: Judgement and insight appear normal. Mood & affect appropriate.   Data Reviewed: I have personally reviewed following labs and imaging studies  CBC:  Recent Labs Lab 02/02/17 1021 02/03/17 0239  WBC 10.2 8.1  HGB 17.0 15.6  HCT 51.4 48.9  MCV 92.6 91.2  PLT 222 184   Basic Metabolic Panel:  Recent Labs Lab 02/02/17 1021 02/03/17 0239 02/05/17 0334 02/06/17 0437  NA 142 141 144 144  K 4.4 4.5 4.2 3.7  CL 105 102 100* 97*  CO2 29 28 35* 41*  GLUCOSE 153* 130* 108* 107*  BUN 19 23* 30* 30*  CREATININE 0.82 0.73 0.79 0.87  CALCIUM 8.8* 8.7* 8.5* 8.6*   GFR: Estimated Creatinine Clearance: 61 mL/min (by C-G formula based on SCr of 0.87 mg/dL).  Radiology Studies: No results found.  Scheduled Meds: . albuterol  5 mg Nebulization Once  . aspirin  325 mg Oral Daily  . cholecalciferol  1,000 Units Oral Q M,W,F  . enoxaparin (LOVENOX) injection  40 mg Subcutaneous Q24H  . ferrous sulfate  325 mg Oral Q M,W,F  . furosemide  40 mg Intravenous BID  . ipratropium-albuterol  3 mL  Nebulization Q6H  . losartan  50 mg Oral Daily  . magnesium oxide  200 mg Oral Q M,W,F  . mouth rinse  15 mL Mouth Rinse BID  . metoprolol succinate  12.5 mg Oral Daily  . multivitamin with minerals  1 tablet Oral Q M,W,F  . nicotine  21 mg Transdermal Daily  . omega-3 acid ethyl esters  1 g Oral Q M,W,F  . pantoprazole  40 mg Oral Daily  . pramipexole  0.25 mg Oral Q24H  . pramipexole  0.25 mg Oral Q24H  . predniSONE  40 mg Oral Q breakfast  . sodium chloride flush  3 mL Intravenous Q12H  . spironolactone  25 mg Oral Daily  . traZODone  200 mg Oral QHS  . vitamin B-12  100 mcg Oral Q M,W,F  . vitamin C  500 mg Oral Q M,W,F   Continuous Infusions: . sodium chloride  LOS: 4 days   Time spent: 25 minutes.  Hazeline Junker, MD [facility] Pager [phone]  If 7PM-7AM, please contact night-coverage [email or web address] Password [email or web address] 02/06/2017, 11:13 AM

## 2017-02-06 NOTE — Care Management Important Message (Signed)
Important Message  Patient Details  Name: Warren White Dusek MRN: [id number] Date of Birth: [birth year]   Medicare Important Message Given:  Yes    Caren MacadamFuller, Tadarrius Burch 02/06/2017, 12:57 PMImportant Message  Patient Details  Name: Warren White Akkerman MRN: [id number] Date of Birth: [birth year]   Medicare Important Message Given:  Yes    Caren MacadamFuller, Jamarcus Laduke 02/06/2017, 12:54 PM

## 2017-02-06 NOTE — Evaluation (Signed)
Physical Therapy Evaluation Patient Details Name: Warren White MRN: [id number] DOB: [birth year] Today's Date: 02/06/2017   History of Present Illness  83 yo male admitted with acute respiratory failure, acute CHF, COPD exac. Hx of COPD, RLS  Clinical Impression  On eval, pt was Min guard assist for mobility. He walked ~250 feet with a rollator. O2 sat 91% on RA during ambulation. Pt c/o legs feeling weak. Recommend daily ambulation around unit with nursing or family supervision/assist. Recommend HHPT f/u after discharge.     Follow Up Recommendations Home health PT;Supervision - Intermittent    Equipment Recommendations   (4 wheeled walker - if pt decides he wants it. At time of eval, he declined the RW)    Recommendations for Other Services       Precautions / Restrictions Precautions Precautions: None Precaution Comments: monitor O2 sats Restrictions Weight Bearing Restrictions: No      Mobility  Bed Mobility               General bed mobility comments: oob in recliner  Transfers Overall transfer level: Needs assistance Equipment used: None Transfers: Sit to/from Stand Sit to Stand: Min guard         General transfer comment: close guard for safety  Ambulation/Gait Ambulation/Gait assistance: Min guard Ambulation Distance (Feet): 250 Feet Assistive device: 4-wheeled walker Gait Pattern/deviations: Step-through pattern;Decreased stride length     General Gait Details: for safety. good gait speed. No LOB. O2 sat 91% 6L  Stairs            Wheelchair Mobility    Modified Rankin (Stroke Patients Only)       Balance                                             Pertinent Vitals/Pain Pain Assessment: No/denies pain    Home Living Family/patient expects to be discharged to:: Private residence Living Arrangements: Spouse/significant other (spouse is in SNF) Available Help at Discharge: Family;Available  PRN/intermittently Type of Home: House Home Access: Stairs to enter   [employer] of Steps: 3 Home Layout: One level Home Equipment: None      Prior Function Level of Independence: Independent               Hand Dominance        Extremity/Trunk Assessment   Upper Extremity Assessment Upper Extremity Assessment: Overall WFL for tasks assessed    Lower Extremity Assessment Lower Extremity Assessment: Generalized weakness    Cervical / Trunk Assessment Cervical / Trunk Assessment: Normal  Communication   Communication: No difficulties  Cognition Arousal/Alertness: Awake/alert Behavior During Therapy: WFL for tasks assessed/performed Overall Cognitive Status: Within Functional Limits for tasks assessed                                        General Comments      Exercises     Assessment/Plan    PT Assessment Patient needs continued PT services  PT Problem List Decreased mobility;Decreased activity tolerance       PT Treatment Interventions Gait training;Therapeutic activities;Therapeutic exercise;Patient/family education;Functional mobility training;DME instruction    PT Goals (Current goals can be found in the Care Plan section)  Acute Rehab PT Goals Patient Stated Goal: regain PLOF. homd PT Goal Formulation:  With patient/family Time For Goal Achievement: 02/20/17 Potential to Achieve Goals: Good    Frequency Min 3X/week   Barriers to discharge        Co-evaluation               AM-PAC PT "6 Clicks" Daily Activity  Outcome Measure Difficulty turning over in bed (including adjusting bedclothes, sheets and blankets)?: None Difficulty moving from lying on back to sitting on the side of the bed? : None Difficulty sitting down on and standing up from a chair with arms (e.g., wheelchair, bedside commode, etc,.)?: None Help needed moving to and from a bed to chair (including a wheelchair)?: None Help needed walking  in hospital room?: A Little Help needed climbing 3-5 steps with a railing? : A Little 6 Click Score: 22    End of Session Equipment Utilized During Treatment: Oxygen Activity Tolerance: Patient tolerated treatment well Patient left: in chair;with call bell/phone within reach;with family/visitor present   PT Visit Diagnosis: Muscle weakness (generalized) (M62.81);Difficulty in walking, not elsewhere classified (R26.2)    Time: 1130-1141 PT Time Calculation (min) (ACUTE ONLY): 11 min   Charges:   PT Evaluation $PT Eval Low Complexity: 1 Low     PT G Codes:          Rebeca AlertJannie Avir Deruiter, MPT Pager: [phone]

## 2017-02-06 NOTE — Consult Note (Signed)
PULMONARY / CRITICAL CARE MEDICINE   Name: Warren White MRN: [id number] DOB: [birth year]    ADMISSION DATE:  02/02/2017 CONSULTATION DATE:  02/06/17  REFERRING MD:  Aubery Lapping Grunz MD  CHIEF COMPLAINT:  COPD management  HISTORY OF PRESENT ILLNESS:   83 year old with past medical history of COPD, emphysema, GERD. He hasn't followed up with a doctor in a while. Admitted on 8/9 with 2-3 weeks of worsening dyspnea, cough, sputum production, wheezing. Noted to be hypoxic to 85% on room air. He has been started on nebulizers, antibiotics, steroids. He initially was on BiPAP in stepdown. He was eventually transitioned to a floor bed, He used to have a fairly active lifestyle with swimming and golf before this admission but reports gradually worsening dyspnea on exertion and dyspnea at rest with chronic cough, sputum production, wheezing over the past few years. He was given an inhaler by his primary care. Ago but this did not help. He does not recall the name of this inhaler.  Also noted to have cardiomegaly with echocardiogram showing EF 35-40 percent on this admission. He has been started on diuresis. But he still continues to be dyspneic with wheezing. PCCM consulted for further management.  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 He continues to smoke.  PAST MEDICAL HISTORY :  He  has a past medical history of COPD (chronic obstructive pulmonary disease) (HCC); Emphysema of lung (HCC); GERD (gastroesophageal reflux disease); Psoriasis; and Restless leg.  PAST SURGICAL HISTORY: He  has a past surgical history that includes Rotator cuff repair (Bilateral); Back surgery; Cholecystectomy; and Total hip arthroplasty (Left).  No Known Allergies  No current facility-administered medications on file prior to encounter.    Current  Outpatient Prescriptions on File Prior to Encounter  Medication Sig  . naproxen sodium (ANAPROX) 220 MG tablet Take 440 mg by mouth 2 (two) times a week.   . pantoprazole (PROTONIX) 40 MG tablet Take 40 mg by mouth every evening.   . pramipexole (MIRAPEX) 0.5 MG tablet Take 0.25 mg by mouth 2 (two) times daily. Takes twice a day but can take another 0.25 mg if he needs it  . traZODone (DESYREL) 100 MG tablet Take 200 mg by mouth at bedtime.  . meclizine (ANTIVERT) 25 MG tablet Take 1 tablet (25 mg total) by mouth 3 (three) times daily as needed for dizziness. (Patient not taking: Reported on 02/02/2017)    FAMILY HISTORY:  His has no family status information on file.    SOCIAL HISTORY: He  reports that he has been smoking Pipe.  He has never used smokeless tobacco. He reports that he drinks about 7.0 oz of alcohol per week . He reports that he does not use drugs.   REVIEW OF SYSTEMS:   All negative; except for those that are bolded, which indicate positives.  Constitutional: weight loss, weight gain, night sweats, fevers, chills, fatigue, weakness.  HEENT: headaches, sore throat, sneezing, nasal congestion, post nasal drip, difficulty swallowing, tooth/dental problems, visual complaints, visual changes, ear aches. Neuro: difficulty with speech, weakness, numbness, ataxia. CV:  chest pain, orthopnea, PND, swelling in lower extremities, dizziness, palpitations, syncope.  Resp: cough, hemoptysis, dyspnea, wheezing. GI: heartburn, indigestion, abdominal pain, nausea, vomiting, diarrhea, constipation, change in bowel habits, loss of appetite, hematemesis, melena, hematochezia.  GU: dysuria, change in color of urine, urgency  or frequency, flank pain, hematuria. MSK: joint pain or swelling, decreased range of motion. Psych: change in mood or affect, depression, anxiety, suicidal ideations, homicidal ideations. Skin: rash, itching, bruising.  SUBJECTIVE:   VITAL SIGNS: BP 137/77 (BP Location:  Right Arm)   Pulse 83   Temp 98.2 F (36.8 C) (Oral)   Resp (!) 21   Ht 5\' 8"  (1.727 m)   Wt 186 lb 11.7 oz (84.7 kg)   SpO2 99%   BMI 28.39 kg/m   HEMODYNAMICS:    VENTILATOR SETTINGS:    INTAKE / OUTPUT: I/O last 3 completed shifts: In: 120 [P.O.:120] Out: 2625 [Urine:2625]  PHYSICAL EXAMINATION: Blood pressure 137/77, pulse 83, temperature 98.2 F (36.8 C), temperature source Oral, resp. rate (!) 21, height 5\' 8"  (1.727 m), weight 186 lb 11.7 oz (84.7 kg), SpO2 99 %. Gen:      No acute distress HEENT:  EOMI, sclera anicteric Neck:     No masses; no thyromegaly Lungs:    Mild expiratory wheeze; normal respiratory effort CV:         Regular rate and rhythm; no murmurs Abd:      + bowel sounds; soft, non-tender; no palpable masses, no distension Ext:    No edema; adequate peripheral perfusion Skin:      Warm and dry; no rash Neuro: alert and oriented x 3 Psych: normal mood and affect  LABS:  BMET  Recent Labs Lab 02/03/17 0239 02/05/17 0334 02/06/17 0437  NA 141 144 144  K 4.5 4.2 3.7  CL 102 100* 97*  CO2 28 35* 41*  BUN 23* 30* 30*  CREATININE 0.73 0.79 0.87  GLUCOSE 130* 108* 107*    Electrolytes  Recent Labs Lab 02/03/17 0239 02/05/17 0334 02/06/17 0437  CALCIUM 8.7* 8.5* 8.6*    CBC  Recent Labs Lab 02/02/17 1021 02/03/17 0239  WBC 10.2 8.1  HGB 17.0 15.6  HCT 51.4 48.9  PLT 222 184    Coag's No results for input(s): APTT, INR in the last 168 hours.  Sepsis Markers No results for input(s): LATICACIDVEN, PROCALCITON, O2SATVEN in the last 168 hours.  ABG No results for input(s): PHART, PCO2ART, PO2ART in the last 168 hours.  Liver Enzymes No results for input(s): AST, ALT, ALKPHOS, BILITOT, ALBUMIN in the last 168 hours.  Cardiac Enzymes No results for input(s): TROPONINI, PROBNP in the last 168 hours.  Glucose No results for input(s): GLUCAP in the last 168 hours.  Imaging No results found.   STUDIES:  CXR 02/02/17 >  cardiomegaly, hyperinflated lungs with mild bronchitis changes. I reviewed the images personally. Echocardiogram 02/03/17 > LVEF 35-40 percent, diffuse hypokinesis, grade 1 diastolic dysfunction  CULTURES:   ANTIBIOTICS: Azithromycin 8/9 - 8/13  SIGNIFICANT EVENTS:   LINES/TUBES:   DISCUSSION: 83 year old with emphysema, chronic bronchitis, smoker admitted with acute respiratory failure secondary to COPD, CHF exacerbation  Acute Resp failiure AECOPD Epmysema, chronic bronchitis - Continue prednisone at current dose - Start anoro, change duonebs to PRN - Mucinex bid for chest congestion - Smoking cessation stressed - Will need eval for home O2 requirement before discharge.  - He will need follow up as outpt in pulm clinic for PFTs.  Discussed with pt and his daughter as bedside.  Chilton Greathouse MD [facility] Pager [phone] If no answer or after 3pm call: [phone] 02/06/2017, 12:04 PM

## 2017-02-06 NOTE — Progress Notes (Signed)
Spoke with pt's daughter concerning Home Health and PCP. Pt have a PCP, but want to change. Information given to pt and daughter to change PCP. [facility], Referral given to in house rep with [facility].

## 2017-02-07 ENCOUNTER — Telehealth: Payer: Self-pay | Admitting: *Deleted

## 2017-02-07 DIAGNOSIS — H8110 Benign paroxysmal vertigo, unspecified ear: Secondary | ICD-10-CM

## 2017-02-07 LAB — BASIC METABOLIC PANEL
ANION GAP: 6 (ref 5–15)
BUN: 26 mg/dL — ABNORMAL HIGH (ref 6–20)
CALCIUM: 8.5 mg/dL — AB (ref 8.9–10.3)
CO2: 40 mmol/L — ABNORMAL HIGH (ref 22–32)
Chloride: 97 mmol/L — ABNORMAL LOW (ref 101–111)
Creatinine, Ser: 0.77 mg/dL (ref 0.61–1.24)
Glucose, Bld: 117 mg/dL — ABNORMAL HIGH (ref 65–99)
POTASSIUM: 3.7 mmol/L (ref 3.5–5.1)
Sodium: 143 mmol/L (ref 135–145)

## 2017-02-07 LAB — MAGNESIUM: MAGNESIUM: 2 mg/dL (ref 1.7–2.4)

## 2017-02-07 MED ORDER — GUAIFENESIN ER 600 MG PO TB12: 600.0000 mg | ORAL_TABLET | Freq: Two times a day (BID) | ORAL | 0 refills | Status: DC

## 2017-02-07 MED ORDER — FUROSEMIDE 40 MG PO TABS: 40.0000 mg | ORAL_TABLET | Freq: Two times a day (BID) | ORAL | 0 refills | Status: DC

## 2017-02-07 MED ORDER — LOSARTAN POTASSIUM 50 MG PO TABS: 50.0000 mg | ORAL_TABLET | Freq: Every day | ORAL | 0 refills | Status: DC

## 2017-02-07 MED ORDER — PREDNISONE 10 MG PO TABS: ORAL_TABLET | ORAL | 0 refills | Status: DC

## 2017-02-07 MED ORDER — UMECLIDINIUM-VILANTEROL 62.5-25 MCG/INH IN AEPB: 1.0000 | INHALATION_SPRAY | Freq: Every day | RESPIRATORY_TRACT | 0 refills | Status: DC

## 2017-02-07 MED ORDER — ALBUTEROL SULFATE HFA 108 (90 BASE) MCG/ACT IN AERS: 2.0000 | INHALATION_SPRAY | Freq: Four times a day (QID) | RESPIRATORY_TRACT | 0 refills | Status: AC | PRN

## 2017-02-07 MED ORDER — METOPROLOL SUCCINATE ER 25 MG PO TB24: 12.5000 mg | ORAL_TABLET | Freq: Every day | ORAL | 0 refills | Status: DC

## 2017-02-07 MED ORDER — ALBUTEROL SULFATE (2.5 MG/3ML) 0.083% IN NEBU: 5.0000 mg | INHALATION_SOLUTION | RESPIRATORY_TRACT | 0 refills | Status: DC | PRN

## 2017-02-07 MED ORDER — SPIRONOLACTONE 25 MG PO TABS: 25.0000 mg | ORAL_TABLET | Freq: Every day | ORAL | 0 refills | Status: DC

## 2017-02-07 NOTE — Progress Notes (Signed)
Subjective:  Doing well denies any chest pain or shortness of breath states feels much better today eager to go home  Objective:  Vital Signs in the last 24 hours: Temp:  [97.7 F (36.5 C)-98.5 F (36.9 C)] 97.7 F (36.5 C) (08/14 0427) Pulse Rate:  [69-95] 70 (08/14 0925) Resp:  [20-21] 20 (08/14 0925) BP: (105-139)/(56-106) 105/56 (08/14 0427) SpO2:  [92 %-96 %] 92 % (08/14 0925) Weight:  [87.9 kg (193 lb 12.6 oz)] 87.9 kg (193 lb 12.6 oz) (08/14 0427)  Intake/Output from previous day: 08/13 0701 - 08/14 0700 In: 2080 [P.O.:2080] Out: 1625 [Urine:1625] Intake/Output from this shift: Total I/O In: 210 [P.O.:210] Out: -   Physical Exam: Neck: no adenopathy, no carotid bruit, no JVD and supple, symmetrical, trachea midline Lungs: Faint expiratory wheezing noted good air entry Heart: regular rate and rhythm, S1, S2 normal and Soft systolic murmur noted Abdomen: soft, non-tender; bowel sounds normal; no masses,  no organomegaly Extremities: extremities normal, atraumatic, no cyanosis or edema  Lab Results: No results for input(s): WBC, HGB, PLT in the last 72 hours.  Recent Labs  02/06/17 0437 02/07/17 0415  NA 144 143  K 3.7 3.7  CL 97* 97*  CO2 41* 40*  GLUCOSE 107* 117*  BUN 30* 26*  CREATININE 0.87 0.77   No results for input(s): TROPONINI in the last 72 hours.  Invalid input(s): CK, MB Hepatic Function Panel No results for input(s): PROT, ALBUMIN, AST, ALT, ALKPHOS, BILITOT, BILIDIR, IBILI in the last 72 hours. No results for input(s): CHOL in the last 72 hours. No results for input(s): PROTIME in the last 72 hours.  Imaging: Imaging results have been reviewed and No results found.  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

## 2017-02-07 NOTE — Discharge Summary (Signed)
Physician Discharge Summary  Warren White:[id number] DOB: 1925-08-15 DOA: 02/02/2017  PCP: Patient, No Pcp Per  Admit date: 02/02/2017 Discharge date: 02/07/2017  Admitted From: Home Disposition: Home   Recommendations for Outpatient Follow-up:  1. Follow up with PCP as scheduled 2. Follow up with pulmonology as scheduled 3. Follow up with cardiology, Dr. Sharyn Lull in next 1 - 2 weeks  Home Health: HH-PT Equipment/Devices: Rolling walker, O2 Discharge Condition: Stable CODE STATUS: DNR Diet recommendation: Heart healthy, 1L fluid restriction  Brief/Interim Summary: Warren Geurtsis an 83 y.o.malewith a history of COPD who generally avoids medical care and presented to the ED 8/9 with worsening 2-3 weeks of dyspnea worse on exertion. He swims three times a week and golfs weekly at baseline, but these have been more difficult. He went to his PCP and was started on a nebulizer which helped. Of note, he attempted to qualify for home oxygen at that visit but maintained SpO2 >90% on exertion. He reported increased sputum, though still clear, with no change in cough. On arrival he had increased work of breathing with SpO2 85% on room air. Nebulizers and antibiotics were provided, though he required BiPAP for respiratory distress. He was admitted to SDU and improved slowly. Echocardiogram showed EF 35-40% and diuresis was started with good effect. Pulmonology was consulted and made recommendations regarding steroids and inhalers which will be continued at discharge. He qualified for home oxygen prior to discharge.   Discharge Diagnoses:  Principal Problem:   Acute respiratory failure with hypoxia (HCC) Active Problems:   Acute exacerbation of COPD with asthma (HCC)   BPV (benign positional vertigo)   RLS (restless legs syndrome)   GERD (gastroesophageal reflux disease)   BPH (benign prostatic hyperplasia)   Dyspnea   Encounter for palliative care   Goals of care, counseling/discussion    Acute systolic CHF (congestive heart failure) (HCC)  Acute hypoxic respiratory failure: Due to COPD with exacerbation and acute CHF. - Appreciate palliative care team assistance, confirmed pt DNR. - Qualifies for supplemental oxygen at discharge.  COPD with acute exacerbation: No infiltrate on CXR, initially received azithromycin.  - Continue prednisone 40 mg x3 days, 30mg  x3 days, 20mg  x3 days, 10mg  x3 days - Started anoro once daily - Continue albuterol nebs PRN and albuterol inhaler PRN - Mucinex 600 mg bid for chest congestion - Smoking cessation stressed - Follow up in pulmonology clinic for PFTs to be arranged by [facility] pulmonology.  Acute systolic CHF: Suspect due to long-term untreated HTN. No h/o CAD or chest pain. ECG largely unchanged from prior with LVH and no ischemic changes. BNP elevated to 363.3 and cardiomegaly on CXR.  - Started lasix 40mg  IV BID with good UOP. Transitioned to lasix 40mg  po BID.  - Started losartan, metoprolol, spironolactone. Cr 0.77 and K 3.7, stable at discharge. Will need follow up within the week to recheck labs and vitals.  - Weight at discharge is 193lbs.   HTN:  - Started metoprolol and losartan. May need dose titration as outpatient.  RLS: Chronic, severe.  - Continue mirapex at 5:15pm and 9:15pm daily  History of BPPV: Asymptomatic currently, seems to be equilibrating.   Discharge Instructions Discharge Instructions    Discharge instructions    Complete by:  As directed    You were admitted with acute heart failure and acute exacerbation of COPD which caused trouble breathing. You have improved with treatment of these conditions and you are stable for discharge with the following recommendations:  - Stat  taking medications as below including a prednisone taper. This and all new prescriptions were sent to your pharmacy.  - Use oxygen at home as directed - You will be called to schedule follow up with pulmonology and cardiology. If  you don't hear from them by the end of the week, please give their office(s) a call.  - If your breathing gets worse, you develop chest pain, or fever seek medical attention right away.     Allergies as of 02/07/2017   No Known Allergies     Medication List    STOP taking these medications   ipratropium-albuterol 0.5-2.5 (3) MG/3ML Soln Commonly known as:  DUONEB   methylPREDNISolone 4 MG tablet Commonly known as:  MEDROL     TAKE these medications   albuterol (2.5 MG/3ML) 0.083% nebulizer solution Commonly known as:  PROVENTIL Take 6 mLs (5 mg total) by nebulization every 4 (four) hours as needed for wheezing or shortness of breath.   albuterol 108 (90 Base) MCG/ACT inhaler Commonly known as:  PROVENTIL HFA;VENTOLIN HFA Inhale 2 puffs into the lungs every 6 (six) hours as needed for wheezing or shortness of breath.   cholecalciferol 1000 units tablet Commonly known as:  VITAMIN D Take 1,000 Units by mouth every Monday, Wednesday, and Friday.   Fish Oil 1000 MG Caps Take 1 capsule by mouth every Monday, Wednesday, and Friday.   furosemide 40 MG tablet Commonly known as:  LASIX Take 1 tablet (40 mg total) by mouth 2 (two) times daily.   glucosamine-chondroitin 500-400 MG tablet Take 3 tablets by mouth every Monday, Wednesday, and Friday.   guaiFENesin 600 MG 12 hr tablet Commonly known as:  MUCINEX Take 1 tablet (600 mg total) by mouth 2 (two) times daily.   Iron 325 (65 Fe) MG Tabs Take 1 tablet by mouth every Monday, Wednesday, and Friday.   losartan 50 MG tablet Commonly known as:  COZAAR Take 1 tablet (50 mg total) by mouth daily.   MAGNESIUM PO Take 1 tablet by mouth every Monday, Wednesday, and Friday.   meclizine 25 MG tablet Commonly known as:  ANTIVERT Take 1 tablet (25 mg total) by mouth 3 (three) times daily as needed for dizziness.   metoprolol succinate 25 MG 24 hr tablet Commonly known as:  TOPROL-XL Take 0.5 tablets (12.5 mg total) by mouth  daily.   multivitamin with minerals Tabs tablet Take 1 tablet by mouth every Monday, Wednesday, and Friday.   naproxen sodium 220 MG tablet Commonly known as:  ANAPROX Take 440 mg by mouth 2 (two) times a week.   pantoprazole 40 MG tablet Commonly known as:  PROTONIX Take 40 mg by mouth every evening.   pramipexole 0.5 MG tablet Commonly known as:  MIRAPEX Take 0.25 mg by mouth 2 (two) times daily. Takes twice a day but can take another 0.25 mg if he needs it   predniSONE 10 MG tablet Commonly known as:  DELTASONE Take by mouth as follows: 40 mg x3 days, 30mg  x3 days, 20mg  x3 days, 10mg  x3 days   spironolactone 25 MG tablet Commonly known as:  ALDACTONE Take 1 tablet (25 mg total) by mouth daily.   traZODone 100 MG tablet Commonly known as:  DESYREL Take 200 mg by mouth at bedtime.   umeclidinium-vilanterol 62.5-25 MCG/INH Aepb Commonly known as:  ANORO ELLIPTA Inhale 1 puff into the lungs daily.   VITAMIN B-12 PO Take 1 tablet by mouth every Monday, Wednesday, and Friday.   vitamin C  500 MG tablet Commonly known as:  ASCORBIC ACID Take 500 mg by mouth every Monday, Wednesday, and Friday.      Follow-up Information    [facility] Follow up.   Specialty:  Pulmonology Why:  You will be contacted to schedule follow up appointment Contact information: [address] [place] [address] [phone]       Rinaldo Cloud, MD Follow up.   Specialty:  Cardiology Why:  You will be contacted to schedule follow up appointment Contact information: [address] [place] [address] [phone]          No Known Allergies  Consultations:  Pulmonology, Dr. Isaiah Serge  Cardiology, Dr. Sharyn Lull  Procedures/Studies: Dg Chest 2 View  Result Date: 02/02/2017 CLINICAL DATA:  Increasing shortness of breath for 4 days. EXAM: CHEST  2 VIEW COMPARISON:  04/12/2012 FINDINGS: Cardiomegaly, with especially prominent left  ventricle. Vascular prominence at the hila with some cephalized blood flow but no Kerley lines or pleural effusion. Stable aortic contours. Chronic mild generalized interstitial coarsening. Lung volumes are large. EKG leads create artifact over the chest. IMPRESSION: 1. Cardiomegaly that has likely progressed from 2013. 2. History of COPD with chronic interstitial coarsening. Electronically Signed   By: Marnee Spring M.D.   On: 02/02/2017 10:23   Subjective: Breathing better again today, no chest pain. No fever.   Discharge Exam: BP (!) 105/56 (BP Location: Right Arm)   Pulse 70   Temp 97.7 F (36.5 C) (Oral)   Resp 20   Ht 5\' 8"  (1.727 m)   Wt 87.9 kg (193 lb 12.6 oz)   SpO2 92%   BMI 29.46 kg/m   General: Pleasant elderly male in no distress Cardiovascular: RRR, no murmur, no JVD, trace pitting LE edema Respiratory: Nonlabored, no crackles. No wheezes.   Labs: BNP (last 3 results)  Recent Labs  02/02/17 1021  BNP 363.3*   Basic Metabolic Panel:  Recent Labs Lab 02/02/17 1021 02/03/17 0239 02/05/17 0334 02/06/17 0437 02/07/17 0415  NA 142 141 144 144 143  K 4.4 4.5 4.2 3.7 3.7  CL 105 102 100* 97* 97*  CO2 29 28 35* 41* 40*  GLUCOSE 153* 130* 108* 107* 117*  BUN 19 23* 30* 30* 26*  CREATININE 0.82 0.73 0.79 0.87 0.77  CALCIUM 8.8* 8.7* 8.5* 8.6* 8.5*  MG  --   --   --   --  2.0   CBC:  Recent Labs Lab 02/02/17 1021 02/03/17 0239  WBC 10.2 8.1  HGB 17.0 15.6  HCT 51.4 48.9  MCV 92.6 91.2  PLT 222 184   Microbiology Recent Results (from the past 240 hour(s))  MRSA PCR Screening     Status: None   Collection Time: 02/02/17  2:37 PM  Result Value Ref Range Status   MRSA by PCR NEGATIVE NEGATIVE Final    Comment:        The GeneXpert MRSA Assay (FDA approved for NASAL specimens only), is one component of a comprehensive MRSA colonization surveillance program. It is not intended to diagnose MRSA infection nor to guide or monitor treatment  for MRSA infections.     Time coordinating discharge: Approximately 40 minutes  Hazeline Junker, MD  [facility] 02/07/2017, 10:51 AM Pager [phone]

## 2017-02-07 NOTE — Care Management Note (Addendum)
Case Management Note  Patient Details  Name: Warren White MRN: [id number] Date of Birth: 19-Oct-1925  Subjective/Objective:                    Action/Plan: Plan to discharge home with [facility]. Follow up with Dr. Jimmey RalphParker 02/08/17 at 11 AM.    Expected Discharge Date:  02/07/17               Expected Discharge Plan:  Home w Home Health Services  In-House Referral:     Discharge planning Services  CM Consult, Follow-up appt scheduled  Post Acute Care Choice:  Home Health, Durable Medical Equipment Choice offered to:  Patient, Adult Children  DME Arranged:  Oxygen DME Agency:  [facility]  [facility]  [facility]  Status of Service:  Completed, signed off  If discussed at Long Length of Stay Meetings, dates discussed:    Additional CommentsGeni Bers:  Reese Senk, RN 02/07/2017, 11:17 AM

## 2017-02-07 NOTE — Telephone Encounter (Signed)
-----   Message from Chilton GreathousePraveen Mannam, MD sent at 02/07/2017  9:25 AM EDT ----- Regarding: Hospital follow up Hi,  Can we make a hospital follow up for this patient for COPD in 2-4 weeks with NP or me. Then follow up with me.  Thanks  PM

## 2017-02-07 NOTE — Progress Notes (Signed)
SATURATION QUALIFICATIONS: (This note is used to comply with regulatory documentation for home oxygen)  Patient Saturations on Room Air at Rest = 88%  Patient Saturations on Room Air while Ambulating = 86%  Patient Saturations on 2 Liters of oxygen while Ambulating = 91-94%  Please briefly explain why patient needs home oxygen: Saturation drops even at rest on room air.

## 2017-02-07 NOTE — Progress Notes (Addendum)
PULMONARY / CRITICAL CARE MEDICINE   Name: Warren White MRN: [id number] DOB: [birth year]    ADMISSION DATE:  02/02/2017 CONSULTATION DATE:  02/06/17  REFERRING MD:  Aubery Lapping Grunz MD  CHIEF COMPLAINT:  COPD management  HISTORY OF PRESENT ILLNESS:   83 year old with past medical history of COPD, emphysema, GERD. He hasn't followed up with a doctor in a while. Admitted on 8/9 with 2-3 weeks of worsening dyspnea, cough, sputum production, wheezing. Noted to be hypoxic to 85% on room air. He has been started on nebulizers, antibiotics, steroids. He initially was on BiPAP in stepdown. He was eventually transitioned to a floor bed, He used to have a fairly active lifestyle with swimming and golf before this admission but reports gradually worsening dyspnea on exertion and dyspnea at rest with chronic cough, sputum production, wheezing over the past few years. He was given an inhaler by his primary care. Ago but this did not help. He does not recall the name of this inhaler.  Also noted to have cardiomegaly with echocardiogram showing EF 35-40 percent on this admission. He has been started on diuresis. But he still continues to be dyspneic with wheezing. PCCM consulted for further management.  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 He continues to smoke.  PAST MEDICAL HISTORY :  He  has a past medical history of COPD (chronic obstructive pulmonary disease) (HCC); Emphysema of lung (HCC); GERD (gastroesophageal reflux disease); Psoriasis; and Restless leg.  PAST SURGICAL HISTORY: He  has a past surgical history that includes Rotator cuff repair (Bilateral); Back surgery; Cholecystectomy; and Total hip arthroplasty (Left).  No Known Allergies  No current facility-administered medications on file prior to encounter.    Current  Outpatient Prescriptions on File Prior to Encounter  Medication Sig  . naproxen sodium (ANAPROX) 220 MG tablet Take 440 mg by mouth 2 (two) times a week.   . pantoprazole (PROTONIX) 40 MG tablet Take 40 mg by mouth every evening.   . pramipexole (MIRAPEX) 0.5 MG tablet Take 0.25 mg by mouth 2 (two) times daily. Takes twice a day but can take another 0.25 mg if he needs it  . traZODone (DESYREL) 100 MG tablet Take 200 mg by mouth at bedtime.  . meclizine (ANTIVERT) 25 MG tablet Take 1 tablet (25 mg total) by mouth 3 (three) times daily as needed for dizziness. (Patient not taking: Reported on 02/02/2017)    FAMILY HISTORY:  His has no family status information on file.    SOCIAL HISTORY: He  reports that he has been smoking Pipe.  He has never used smokeless tobacco. He reports that he drinks about 7.0 oz of alcohol per week . He reports that he does not use drugs.   REVIEW OF SYSTEMS:   All negative; except for those that are bolded, which indicate positives.  Constitutional: weight loss, weight gain, night sweats, fevers, chills, fatigue, weakness.  HEENT: headaches, sore throat, sneezing, nasal congestion, post nasal drip, difficulty swallowing, tooth/dental problems, visual complaints, visual changes, ear aches. Neuro: difficulty with speech, weakness, numbness, ataxia. CV:  chest pain, orthopnea, PND, swelling in lower extremities, dizziness, palpitations, syncope.  Resp: cough, hemoptysis, dyspnea, wheezing. GI: heartburn, indigestion, abdominal pain, nausea, vomiting, diarrhea, constipation, change in bowel habits, loss of appetite, hematemesis, melena, hematochezia.  GU: dysuria, change in color of urine, urgency  or frequency, flank pain, hematuria. MSK: joint pain or swelling, decreased range of motion. Psych: change in mood or affect, depression, anxiety, suicidal ideations, homicidal ideations. Skin: rash, itching, bruising.  SUBJECTIVE:  Feels that breathing is improved.    VITAL SIGNS: BP (!) 105/56 (BP Location: Right Arm)   Pulse 71   Temp 97.7 F (36.5 C) (Oral)   Resp 20   Ht 5\' 8"  (1.727 m)   Wt 193 lb 12.6 oz (87.9 kg)   SpO2 92%   BMI 29.46 kg/m   HEMODYNAMICS:    VENTILATOR SETTINGS:    INTAKE / OUTPUT: I/O last 3 completed shifts: In: 2080 [P.O.:2080] Out: 2025 [Urine:2025]  PHYSICAL EXAMINATION: Blood pressure (!) 105/56, pulse 71, temperature 97.7 F (36.5 C), temperature source Oral, resp. rate 20, height 5\' 8"  (1.727 m), weight 193 lb 12.6 oz (87.9 kg), SpO2 92 %. Gen:      No acute distress HEENT:  EOMI, sclera anicteric Neck:     No masses; no thyromegaly Lungs:    Mild exp wheeze; normal respiratory effort CV:         Regular rate and rhythm; no murmurs Abd:      + bowel sounds; soft, non-tender; no palpable masses, no distension Ext:    No edema; adequate peripheral perfusion Skin:      Warm and dry; no rash Neuro: alert and oriented x 3 Psych: normal mood and affect  LABS:  BMET  Recent Labs Lab 02/05/17 0334 02/06/17 0437 02/07/17 0415  NA 144 144 143  K 4.2 3.7 3.7  CL 100* 97* 97*  CO2 35* 41* 40*  BUN 30* 30* 26*  CREATININE 0.79 0.87 0.77  GLUCOSE 108* 107* 117*    Electrolytes  Recent Labs Lab 02/05/17 0334 02/06/17 0437 02/07/17 0415  CALCIUM 8.5* 8.6* 8.5*  MG  --   --  2.0    CBC  Recent Labs Lab 02/02/17 1021 02/03/17 0239  WBC 10.2 8.1  HGB 17.0 15.6  HCT 51.4 48.9  PLT 222 184    Coag's No results for input(s): APTT, INR in the last 168 hours.  Sepsis Markers No results for input(s): LATICACIDVEN, PROCALCITON, O2SATVEN in the last 168 hours.  ABG No results for input(s): PHART, PCO2ART, PO2ART in the last 168 hours.  Liver Enzymes No results for input(s): AST, ALT, ALKPHOS, BILITOT, ALBUMIN in the last 168 hours.  Cardiac Enzymes No results for input(s): TROPONINI, PROBNP in the last 168 hours.  Glucose No results for input(s): GLUCAP in the last 168  hours.  Imaging No results found.   STUDIES:  CXR 02/02/17 > cardiomegaly, hyperinflated lungs with mild bronchitis changes. I reviewed the images personally. Echocardiogram 02/03/17 > LVEF 35-40 percent, diffuse hypokinesis, grade 1 diastolic dysfunction  CULTURES:   ANTIBIOTICS: Azithromycin 8/9 - 8/13  SIGNIFICANT EVENTS:   LINES/TUBES:  DISCUSSION: 83 year old with emphysema, chronic bronchitis, smoker admitted with acute respiratory failure secondary to COPD, CHF exacerbation  Acute Resp failiure AECOPD Epmysema, chronic bronchitis - Continue prednisone 40 mg. Can reduce by 10 mg every 3 days - Can be discharged on anoro once daily, albuterol nebs PRN and albuterol inhaler PRN - Mucinex 600 mg bid for chest congestion - Smoking cessation stressed - Will need eval for home O2 requirement before discharge.  - He will need follow up as outpt in pulm clinic for PFTs. We will make the appointment  Discussed with pt and his daughter as bedside.  Chilton GreathousePraveen Placido Hangartner MD [facility] Pager [phone] If no answer or after 3pm call: [phone] 02/07/2017, 9:17 AM

## 2017-02-07 NOTE — Telephone Encounter (Signed)
Left message for pt to call back. He is currently in the hospital.

## 2017-02-08 ENCOUNTER — Encounter: Payer: Self-pay | Admitting: Family Medicine

## 2017-02-08 ENCOUNTER — Ambulatory Visit (INDEPENDENT_AMBULATORY_CARE_PROVIDER_SITE_OTHER): Payer: Medicare Other | Admitting: Family Medicine

## 2017-02-08 VITALS — BP 120/70 | HR 80 | Ht 68.0 in | Wt 194.0 lb

## 2017-02-08 DIAGNOSIS — I10 Essential (primary) hypertension: Secondary | ICD-10-CM

## 2017-02-08 DIAGNOSIS — J9691 Respiratory failure, unspecified with hypoxia: Secondary | ICD-10-CM

## 2017-02-08 DIAGNOSIS — I119 Hypertensive heart disease without heart failure: Secondary | ICD-10-CM | POA: Insufficient documentation

## 2017-02-08 DIAGNOSIS — J449 Chronic obstructive pulmonary disease, unspecified: Secondary | ICD-10-CM

## 2017-02-08 DIAGNOSIS — I5022 Chronic systolic (congestive) heart failure: Secondary | ICD-10-CM | POA: Diagnosis not present

## 2017-02-08 DIAGNOSIS — K219 Gastro-esophageal reflux disease without esophagitis: Secondary | ICD-10-CM

## 2017-02-08 DIAGNOSIS — G2581 Restless legs syndrome: Secondary | ICD-10-CM

## 2017-02-08 LAB — BASIC METABOLIC PANEL
BUN: 37 mg/dL — AB (ref 6–23)
CO2: 40 meq/L — AB (ref 19–32)
Calcium: 8.8 mg/dL (ref 8.4–10.5)
Chloride: 97 mEq/L (ref 96–112)
Creatinine, Ser: 0.93 mg/dL (ref 0.40–1.50)
GFR: 81.26 mL/min (ref >60.00)
GLUCOSE: 131 mg/dL — AB (ref 70–99)
POTASSIUM: 4.3 meq/L (ref 3.5–5.1)
SODIUM: 142 meq/L (ref 135–145)

## 2017-02-08 LAB — CBC WITH DIFFERENTIAL/PLATELET
Basophils Absolute: 0 10*3/uL (ref 0.0–0.1)
Basophils Relative: 0.2 % (ref 0.0–3.0)
Eosinophils Absolute: 0.2 10*3/uL (ref 0.0–0.7)
Eosinophils Relative: 2 % (ref 0.0–5.0)
HEMATOCRIT: 52.6 % — AB (ref 39.0–52.0)
HEMOGLOBIN: 17.1 g/dL — AB (ref 13.0–17.0)
LYMPHS PCT: 12.2 % (ref 12.0–46.0)
Lymphs Abs: 1.2 10*3/uL (ref 0.7–4.0)
MCHC: 32.5 g/dL (ref 30.0–36.0)
MCV: 92.1 fl (ref 78.0–100.0)
MONO ABS: 0.8 10*3/uL (ref 0.1–1.0)
Monocytes Relative: 7.9 % (ref 3.0–12.0)
Neutro Abs: 7.7 10*3/uL (ref 1.4–7.7)
Neutrophils Relative %: 77.7 % — ABNORMAL HIGH (ref 43.0–77.0)
Platelets: 223 10*3/uL (ref 150.0–400.0)
RBC: 5.7 Mil/uL (ref 4.22–5.81)
RDW: 15.6 % — ABNORMAL HIGH (ref 11.5–15.5)
WBC: 9.9 10*3/uL (ref 4.0–10.5)

## 2017-02-08 MED ORDER — LOSARTAN POTASSIUM 50 MG PO TABS: 50.0000 mg | ORAL_TABLET | Freq: Every day | ORAL | 3 refills | Status: DC

## 2017-02-08 MED ORDER — PANTOPRAZOLE SODIUM 40 MG PO TBEC: 40.0000 mg | DELAYED_RELEASE_TABLET | Freq: Every evening | ORAL | 3 refills | Status: DC

## 2017-02-08 MED ORDER — METOPROLOL SUCCINATE ER 25 MG PO TB24: 12.5000 mg | ORAL_TABLET | Freq: Every day | ORAL | 3 refills | Status: DC

## 2017-02-08 MED ORDER — TRAZODONE HCL 100 MG PO TABS: 200.0000 mg | ORAL_TABLET | Freq: Every day | ORAL | 3 refills | Status: DC

## 2017-02-08 MED ORDER — FUROSEMIDE 40 MG PO TABS: 40.0000 mg | ORAL_TABLET | Freq: Two times a day (BID) | ORAL | 3 refills | Status: DC

## 2017-02-08 MED ORDER — PRAMIPEXOLE DIHYDROCHLORIDE 0.5 MG PO TABS: 0.2500 mg | ORAL_TABLET | Freq: Two times a day (BID) | ORAL | 3 refills | Status: DC

## 2017-02-08 MED ORDER — SPIRONOLACTONE 25 MG PO TABS: 25.0000 mg | ORAL_TABLET | Freq: Every day | ORAL | 3 refills | Status: DC

## 2017-02-08 NOTE — Assessment & Plan Note (Signed)
Stable. Continue mirapex.  °

## 2017-02-08 NOTE — Patient Instructions (Signed)
I sent in a refill for your medications.  Please follow up with the lung doctor and heart doctor.  Come back to see me in 1 month.  Take care,  Dr Jimmey RalphParker

## 2017-02-08 NOTE — Assessment & Plan Note (Signed)
Stable today of supplemental oxygen. Continue Anoro and albuterol as needed. HAs follow up with pulmonology scheduled. Will need PFTs. Consider cardiopulmonary rehab. Defer further management to pulmonology.

## 2017-02-08 NOTE — Assessment & Plan Note (Signed)
Stable -continue protonix 

## 2017-02-08 NOTE — Assessment & Plan Note (Signed)
Trace pretibial edema today. Weight stable. Continue regimen of losartan, metoprolol, lasix, and spironolactone. Check BMET. Has cardiology follow up scheduled.

## 2017-02-08 NOTE — Progress Notes (Signed)
Subjective:  Warren White is a 83 y.o. male who presents today with a chief complaint of hospital follow up for respiratory failure and to establish care.   HPI:  Respiratory Failure / COPD Patient admitted to the hospital on 02/02/2017 with worsening dyspnea. He had a history of COPD/emphysema but had not follow up with a doctor. During his hospitalization, he briefly required bipap, however was eventually able to be discharged on supplemental oxygen. He was discharged on albuterol, anoro, and a prednisone taper. Since discharge symptoms are doing a little better. He can walk more. Has not tried walking up steps.   HFrEF Patient also found to have an EF of 35-40%. He was started on losartan, metoprolol, and spironolactone at the time of discharge. His weight at the time of discharge was 193 pounds. He has follow up with cardiology scheduled.   Hypertension New diagnosis while in the hospital currently on the above medication for heart failure. No obvious medication side effects. No chest pain. Only trace leg swelling.   RLS Chronic problem. He is currently stable on mirapex 0.80m bid. No falls or confusion.   ROS: Per HPI, otherwise all systems reviewed and are negative  PMH:  The following were reviewed and entered/updated in epic: Past Medical History:  Diagnosis Date  . COPD (chronic obstructive pulmonary disease) ([facility]   . [place] of lung ([facility]   . GERD (gastroesophageal reflux disease)   . Psoriasis   . Restless leg    Patient Active Problem List   Diagnosis Date Noted  . Essential hypertension 02/08/2017  . HFrEF (heart failure with reduced ejection fraction) ([facility] 02/04/2017  . Respiratory failure with hypoxia ([facility] 02/04/2017  . Dyspnea   . Encounter for palliative care   . Goals of care, counseling/discussion   . COPD (chronic obstructive pulmonary disease) ([facility] 02/02/2017  . BPV (benign positional vertigo) 02/02/2017  . RLS (restless legs syndrome)  02/02/2017  . GERD (gastroesophageal reflux disease) 02/02/2017  . Psoriasis 02/02/2017  . BPH (benign prostatic hyperplasia) 02/02/2017   Past Surgical History:  Procedure Laterality Date  . BACK SURGERY    . CHOLECYSTECTOMY    . ROTATOR CUFF REPAIR Bilateral   . TOTAL HIP ARTHROPLASTY Left    Family History  Problem Relation Age of Onset  . Restless legs syndrome Mother    Medications- reviewed and updated Current Outpatient Prescriptions  Medication Sig Dispense Refill  . albuterol (PROVENTIL HFA;VENTOLIN HFA) 108 (90 Base) MCG/ACT inhaler Inhale 2 puffs into the lungs every 6 (six) hours as needed for wheezing or shortness of breath. 1 Inhaler 0  . albuterol (PROVENTIL) (2.5 MG/3ML) 0.083% nebulizer solution Take 6 mLs (5 mg total) by nebulization every 4 (four) hours as needed for wheezing or shortness of breath. 75 mL 0  . cholecalciferol (VITAMIN D) 1000 units tablet Take 1,000 Units by mouth every Monday, Wednesday, and Friday.    . Cyanocobalamin (VITAMIN B-12 PO) Take 1 tablet by mouth every Monday, Wednesday, and Friday.    . Ferrous Sulfate (IRON) 325 (65 Fe) MG TABS Take 1 tablet by mouth every Monday, Wednesday, and Friday.    . furosemide (LASIX) 40 MG tablet Take 1 tablet (40 mg total) by mouth 2 (two) times daily. 180 tablet 3  . glucosamine-chondroitin 500-400 MG tablet Take 3 tablets by mouth every Monday, Wednesday, and Friday.    .Marland KitchenguaiFENesin (MUCINEX) 600 MG 12 hr tablet Take 1 tablet (600 mg total) by mouth 2 (two) times daily.  60 tablet 0  . losartan (COZAAR) 50 MG tablet Take 1 tablet (50 mg total) by mouth daily. 90 tablet 3  . MAGNESIUM PO Take 1 tablet by mouth every Monday, Wednesday, and Friday.    . metoprolol succinate (TOPROL-XL) 25 MG 24 hr tablet Take 0.5 tablets (12.5 mg total) by mouth daily. 45 tablet 3  . Multiple Vitamin (MULTIVITAMIN WITH MINERALS) TABS tablet Take 1 tablet by mouth every Monday, Wednesday, and Friday.    . naproxen sodium  (ANAPROX) 220 MG tablet Take 440 mg by mouth 2 (two) times a week.     . Omega-3 Fatty Acids (FISH OIL) 1000 MG CAPS Take 1 capsule by mouth every Monday, Wednesday, and Friday.    . pantoprazole (PROTONIX) 40 MG tablet Take 1 tablet (40 mg total) by mouth every evening. 90 tablet 3  . pramipexole (MIRAPEX) 0.5 MG tablet Take 0.5 tablets (0.25 mg total) by mouth 2 (two) times daily. Takes twice a day but can take another 0.25 mg if he needs it 90 tablet 3  . predniSONE (DELTASONE) 10 MG tablet Take by mouth as follows: 40 mg x3 days, 81m x3 days, 240mx3 days, 1086m3 days 30 tablet 0  . spironolactone (ALDACTONE) 25 MG tablet Take 1 tablet (25 mg total) by mouth daily. 90 tablet 3  . umeclidinium-vilanterol (ANORO ELLIPTA) 62.5-25 MCG/INH AEPB Inhale 1 puff into the lungs daily. 30 each 0  . vitamin C (ASCORBIC ACID) 500 MG tablet Take 500 mg by mouth every Monday, Wednesday, and Friday.    . traZODone (DESYREL) 100 MG tablet Take 2 tablets (200 mg total) by mouth at bedtime. 180 tablet 3   No current facility-administered medications for this visit.     Allergies-reviewed and updated No Known Allergies  Social History   Social History  . Marital status: Married    Spouse name: N/A  . Number of children: N/A  . Years of education: N/A   Social History Main Topics  . Smoking status: Current Every Day Smoker    Types: Pipe  . Smokeless tobacco: Never Used  . Alcohol use 7.0 oz/week    14 Standard drinks or equivalent per week  . Drug use: No  . Sexual activity: No   Other Topics Concern  . None   Social History Narrative  . None   Objective:  Physical Exam: BP 120/70   Pulse 80   Ht 5' 8"  (1.727 m)   Wt 194 lb (88 kg)   SpO2 94%   BMI 29.50 kg/m   Gen: NAD, resting comfortably, supplemental oxygen in place, talking in full sentences.  CV: RRR with no murmurs appreciated Pulm: Supplemental oxygen in place. Fair air movement throughout without any wheezes or crackles.    GI: Normal bowel sounds present. Soft, Nontender, Nondistended. MSK: Trace pretibial edema bilaterally, no cyanosis Skin: warm, dry Neuro: grossly normal, moves all extremities Psych: Normal affect and thought content  Results for orders placed or performed during the hospital encounter of 02/02/17 (from the past 72 hour(s))  Basic metabolic panel     Status: Abnormal   Collection Time: 02/06/17  4:37 AM  Result Value Ref Range   Sodium 144 135 - 145 mmol/L   Potassium 3.7 3.5 - 5.1 mmol/L   Chloride 97 (L) 101 - 111 mmol/L   CO2 41 (H) 22 - 32 mmol/L   Glucose, Bld 107 (H) 65 - 99 mg/dL   BUN 30 (H) 6 - 20 mg/dL  Creatinine, Ser 0.87 0.61 - 1.24 mg/dL   Calcium 8.6 (L) 8.9 - 10.3 mg/dL   GFR calc non Af Amer >60 >60 mL/min   GFR calc Af Amer >60 >60 mL/min    Comment: (NOTE) The eGFR has been calculated using the CKD EPI equation. This calculation has not been validated in all clinical situations. eGFR's persistently <60 mL/min signify possible Chronic Kidney Disease.    Anion gap 6 5 - 15  Basic metabolic panel     Status: Abnormal   Collection Time: 02/07/17  4:15 AM  Result Value Ref Range   Sodium 143 135 - 145 mmol/L   Potassium 3.7 3.5 - 5.1 mmol/L   Chloride 97 (L) 101 - 111 mmol/L   CO2 40 (H) 22 - 32 mmol/L   Glucose, Bld 117 (H) 65 - 99 mg/dL   BUN 26 (H) 6 - 20 mg/dL   Creatinine, Ser 0.77 0.61 - 1.24 mg/dL   Calcium 8.5 (L) 8.9 - 10.3 mg/dL   GFR calc non Af Amer >60 >60 mL/min   GFR calc Af Amer >60 >60 mL/min    Comment: (NOTE) The eGFR has been calculated using the CKD EPI equation. This calculation has not been validated in all clinical situations. eGFR's persistently <60 mL/min signify possible Chronic Kidney Disease.    Anion gap 6 5 - 15  Magnesium     Status: None   Collection Time: 02/07/17  4:15 AM  Result Value Ref Range   Magnesium 2.0 1.7 - 2.4 mg/dL   Assessment/Plan:  COPD (chronic obstructive pulmonary disease) (HCC) Stable today  of supplemental oxygen. Continue Anoro and albuterol as needed. HAs follow up with pulmonology scheduled. Will need PFTs. Consider cardiopulmonary rehab. Defer further management to pulmonology.   HFrEF (heart failure with reduced ejection fraction) (HCC) Trace pretibial edema today. Weight stable. Continue regimen of losartan, metoprolol, lasix, and spironolactone. Check BMET. Has cardiology follow up scheduled.   Essential hypertension At goal. Continue losartan, metoprolol, and lasix.   GERD (gastroesophageal reflux disease) Stable continue protonix.  RLS (restless legs syndrome) Stable. Continue mirapex.   Follow up in 1 month.   Algis Greenhouse. Jerline Pain, MD 02/08/2017 1:14 PM

## 2017-02-08 NOTE — Assessment & Plan Note (Signed)
At goal. Continue losartan, metoprolol, and lasix.

## 2017-02-09 DIAGNOSIS — J441 Chronic obstructive pulmonary disease with (acute) exacerbation: Secondary | ICD-10-CM | POA: Diagnosis not present

## 2017-02-09 DIAGNOSIS — Z720 Tobacco use: Secondary | ICD-10-CM | POA: Diagnosis not present

## 2017-02-09 DIAGNOSIS — I5021 Acute systolic (congestive) heart failure: Secondary | ICD-10-CM | POA: Diagnosis not present

## 2017-02-09 DIAGNOSIS — Z9981 Dependence on supplemental oxygen: Secondary | ICD-10-CM | POA: Diagnosis not present

## 2017-02-09 DIAGNOSIS — K219 Gastro-esophageal reflux disease without esophagitis: Secondary | ICD-10-CM | POA: Diagnosis not present

## 2017-02-11 DIAGNOSIS — Z720 Tobacco use: Secondary | ICD-10-CM | POA: Diagnosis not present

## 2017-02-11 DIAGNOSIS — I5021 Acute systolic (congestive) heart failure: Secondary | ICD-10-CM | POA: Diagnosis not present

## 2017-02-11 DIAGNOSIS — J441 Chronic obstructive pulmonary disease with (acute) exacerbation: Secondary | ICD-10-CM | POA: Diagnosis not present

## 2017-02-11 DIAGNOSIS — K219 Gastro-esophageal reflux disease without esophagitis: Secondary | ICD-10-CM | POA: Diagnosis not present

## 2017-02-11 DIAGNOSIS — Z9981 Dependence on supplemental oxygen: Secondary | ICD-10-CM | POA: Diagnosis not present

## 2017-02-14 ENCOUNTER — Telehealth: Payer: Self-pay | Admitting: Family Medicine

## 2017-02-14 DIAGNOSIS — Z720 Tobacco use: Secondary | ICD-10-CM | POA: Diagnosis not present

## 2017-02-14 DIAGNOSIS — K219 Gastro-esophageal reflux disease without esophagitis: Secondary | ICD-10-CM | POA: Diagnosis not present

## 2017-02-14 DIAGNOSIS — Z9981 Dependence on supplemental oxygen: Secondary | ICD-10-CM | POA: Diagnosis not present

## 2017-02-14 DIAGNOSIS — I5021 Acute systolic (congestive) heart failure: Secondary | ICD-10-CM | POA: Diagnosis not present

## 2017-02-14 DIAGNOSIS — J441 Chronic obstructive pulmonary disease with (acute) exacerbation: Secondary | ICD-10-CM | POA: Diagnosis not present

## 2017-02-14 NOTE — Telephone Encounter (Signed)
ROI faxed to [facility] Dr. Leonette Most

## 2017-02-14 NOTE — Telephone Encounter (Signed)
PT is aware via voicemail of verbal orders.

## 2017-02-14 NOTE — Telephone Encounter (Signed)
Ok to give verbal orders.  Warren White. Jimmey Ralph, MD 02/14/2017 1:20 PM

## 2017-02-14 NOTE — Telephone Encounter (Signed)
Threasa Alpha PT, verbal orders.  PT Eval today.  Thin White Sputum Has continous oxygen but using intermittently With moderate exertion Oxygen dropped to 92%  Patient would like to discontinue Oxygen  Engaging abdominal muscles result in moderate distension. No pain or delay in bowel movements.  Would benefit from PT, focus on balance.  1x a wk for 3 wks.  Has had two falls in the past year.  Social info: wife returning home from rehab stay, post stroke, on tomorrow 08/22.  Patient requests no additional assistance in home.  Ty,  -LL

## 2017-02-14 NOTE — Telephone Encounter (Signed)
Please review verbal order request.

## 2017-02-17 ENCOUNTER — Telehealth: Payer: Self-pay | Admitting: Family Medicine

## 2017-02-17 DIAGNOSIS — Z720 Tobacco use: Secondary | ICD-10-CM | POA: Diagnosis not present

## 2017-02-17 DIAGNOSIS — K219 Gastro-esophageal reflux disease without esophagitis: Secondary | ICD-10-CM | POA: Diagnosis not present

## 2017-02-17 DIAGNOSIS — Z9981 Dependence on supplemental oxygen: Secondary | ICD-10-CM | POA: Diagnosis not present

## 2017-02-17 DIAGNOSIS — I5021 Acute systolic (congestive) heart failure: Secondary | ICD-10-CM | POA: Diagnosis not present

## 2017-02-17 DIAGNOSIS — J441 Chronic obstructive pulmonary disease with (acute) exacerbation: Secondary | ICD-10-CM | POA: Diagnosis not present

## 2017-02-17 NOTE — Telephone Encounter (Signed)
Rec'd from [facility] Dr. Leonette Most forwarded 86 pages to [name] M mD

## 2017-02-17 NOTE — Telephone Encounter (Signed)
PT never received the voicemail, I updated him that Dr. Jimmey Ralph approved the verbal orders, no further action required.

## 2017-02-17 NOTE — Telephone Encounter (Signed)
error 

## 2017-02-21 ENCOUNTER — Encounter: Payer: Self-pay | Admitting: Family Medicine

## 2017-02-22 DIAGNOSIS — J441 Chronic obstructive pulmonary disease with (acute) exacerbation: Secondary | ICD-10-CM | POA: Diagnosis not present

## 2017-02-22 DIAGNOSIS — Z720 Tobacco use: Secondary | ICD-10-CM | POA: Diagnosis not present

## 2017-02-22 DIAGNOSIS — Z9981 Dependence on supplemental oxygen: Secondary | ICD-10-CM | POA: Diagnosis not present

## 2017-02-22 DIAGNOSIS — K219 Gastro-esophageal reflux disease without esophagitis: Secondary | ICD-10-CM | POA: Diagnosis not present

## 2017-02-22 DIAGNOSIS — I5021 Acute systolic (congestive) heart failure: Secondary | ICD-10-CM | POA: Diagnosis not present

## 2017-02-23 DIAGNOSIS — E785 Hyperlipidemia, unspecified: Secondary | ICD-10-CM | POA: Diagnosis not present

## 2017-02-23 DIAGNOSIS — M1990 Unspecified osteoarthritis, unspecified site: Secondary | ICD-10-CM | POA: Diagnosis not present

## 2017-02-23 DIAGNOSIS — I2510 Atherosclerotic heart disease of native coronary artery without angina pectoris: Secondary | ICD-10-CM | POA: Diagnosis not present

## 2017-02-23 DIAGNOSIS — I119 Hypertensive heart disease without heart failure: Secondary | ICD-10-CM | POA: Diagnosis not present

## 2017-02-23 DIAGNOSIS — I5042 Chronic combined systolic (congestive) and diastolic (congestive) heart failure: Secondary | ICD-10-CM | POA: Diagnosis not present

## 2017-02-23 DIAGNOSIS — I10 Essential (primary) hypertension: Secondary | ICD-10-CM | POA: Diagnosis not present

## 2017-02-24 DIAGNOSIS — J441 Chronic obstructive pulmonary disease with (acute) exacerbation: Secondary | ICD-10-CM | POA: Diagnosis not present

## 2017-02-24 DIAGNOSIS — Z720 Tobacco use: Secondary | ICD-10-CM | POA: Diagnosis not present

## 2017-02-24 DIAGNOSIS — Z9981 Dependence on supplemental oxygen: Secondary | ICD-10-CM | POA: Diagnosis not present

## 2017-02-24 DIAGNOSIS — K219 Gastro-esophageal reflux disease without esophagitis: Secondary | ICD-10-CM | POA: Diagnosis not present

## 2017-02-24 DIAGNOSIS — I5021 Acute systolic (congestive) heart failure: Secondary | ICD-10-CM | POA: Diagnosis not present

## 2017-02-27 DIAGNOSIS — Z9981 Dependence on supplemental oxygen: Secondary | ICD-10-CM | POA: Diagnosis not present

## 2017-02-27 DIAGNOSIS — Z720 Tobacco use: Secondary | ICD-10-CM | POA: Diagnosis not present

## 2017-02-27 DIAGNOSIS — J441 Chronic obstructive pulmonary disease with (acute) exacerbation: Secondary | ICD-10-CM | POA: Diagnosis not present

## 2017-02-27 DIAGNOSIS — K219 Gastro-esophageal reflux disease without esophagitis: Secondary | ICD-10-CM | POA: Diagnosis not present

## 2017-02-27 DIAGNOSIS — I5021 Acute systolic (congestive) heart failure: Secondary | ICD-10-CM | POA: Diagnosis not present

## 2017-02-28 ENCOUNTER — Telehealth: Payer: Self-pay | Admitting: Family Medicine

## 2017-02-28 NOTE — Telephone Encounter (Signed)
Patient is being discharged from nursing and is home now. Rep would like a call back to discuss.  Thank you,  -LL

## 2017-03-03 DIAGNOSIS — Z720 Tobacco use: Secondary | ICD-10-CM | POA: Diagnosis not present

## 2017-03-03 DIAGNOSIS — J441 Chronic obstructive pulmonary disease with (acute) exacerbation: Secondary | ICD-10-CM | POA: Diagnosis not present

## 2017-03-03 DIAGNOSIS — I5021 Acute systolic (congestive) heart failure: Secondary | ICD-10-CM | POA: Diagnosis not present

## 2017-03-03 DIAGNOSIS — Z9981 Dependence on supplemental oxygen: Secondary | ICD-10-CM | POA: Diagnosis not present

## 2017-03-03 DIAGNOSIS — K219 Gastro-esophageal reflux disease without esophagitis: Secondary | ICD-10-CM | POA: Diagnosis not present

## 2017-03-06 NOTE — Telephone Encounter (Signed)
Orders recently signed and faxed that to [facility].

## 2017-03-23 ENCOUNTER — Other Ambulatory Visit: Payer: Self-pay | Admitting: Family Medicine

## 2017-03-27 DIAGNOSIS — I110 Hypertensive heart disease with heart failure: Secondary | ICD-10-CM

## 2017-03-27 DIAGNOSIS — I5032 Chronic diastolic (congestive) heart failure: Secondary | ICD-10-CM

## 2017-03-27 HISTORY — DX: Chronic diastolic (congestive) heart failure: I50.32

## 2017-03-27 HISTORY — PX: TRANSTHORACIC ECHOCARDIOGRAM: SHX275

## 2017-03-27 HISTORY — DX: Hypertensive heart disease with heart failure: I11.0

## 2017-04-05 DIAGNOSIS — E785 Hyperlipidemia, unspecified: Secondary | ICD-10-CM | POA: Diagnosis not present

## 2017-04-05 DIAGNOSIS — I119 Hypertensive heart disease without heart failure: Secondary | ICD-10-CM | POA: Diagnosis not present

## 2017-04-05 DIAGNOSIS — I5042 Chronic combined systolic (congestive) and diastolic (congestive) heart failure: Secondary | ICD-10-CM | POA: Diagnosis not present

## 2017-04-05 DIAGNOSIS — I2510 Atherosclerotic heart disease of native coronary artery without angina pectoris: Secondary | ICD-10-CM | POA: Diagnosis not present

## 2017-04-05 NOTE — Telephone Encounter (Signed)
Rep from [facility] says they never received plan of care RE orders that were faxed back in September.  Rep will refax today. Upon receipt it will be placed in Dr. Lavone Neri folder.  Ty,  -LL

## 2017-04-06 NOTE — Telephone Encounter (Signed)
Papers received, given to Dr. Jimmey Ralph.

## 2017-04-06 NOTE — Telephone Encounter (Signed)
Documents faxed with confirmation.

## 2017-04-10 ENCOUNTER — Ambulatory Visit (INDEPENDENT_AMBULATORY_CARE_PROVIDER_SITE_OTHER): Payer: Medicare Other | Admitting: Family Medicine

## 2017-04-10 ENCOUNTER — Other Ambulatory Visit: Payer: Self-pay

## 2017-04-10 ENCOUNTER — Encounter: Payer: Self-pay | Admitting: Family Medicine

## 2017-04-10 ENCOUNTER — Ambulatory Visit (INDEPENDENT_AMBULATORY_CARE_PROVIDER_SITE_OTHER): Payer: Medicare Other

## 2017-04-10 VITALS — BP 140/80 | HR 81 | Ht 68.0 in | Wt 200.8 lb

## 2017-04-10 DIAGNOSIS — R059 Cough, unspecified: Secondary | ICD-10-CM

## 2017-04-10 DIAGNOSIS — L989 Disorder of the skin and subcutaneous tissue, unspecified: Secondary | ICD-10-CM

## 2017-04-10 DIAGNOSIS — J449 Chronic obstructive pulmonary disease, unspecified: Secondary | ICD-10-CM

## 2017-04-10 DIAGNOSIS — I5022 Chronic systolic (congestive) heart failure: Secondary | ICD-10-CM | POA: Diagnosis not present

## 2017-04-10 DIAGNOSIS — R05 Cough: Secondary | ICD-10-CM

## 2017-04-10 DIAGNOSIS — Z23 Encounter for immunization: Secondary | ICD-10-CM

## 2017-04-10 NOTE — Patient Instructions (Signed)
Take an extra dose of lasix if your weight increases by more than a few pounds.  Let me know if freezing did not work for your finger  I will send in a referral to your pulmonologist  Come back to see me in 2-3 months, or sooner as needed.  Take care,  Dr Jimmey Ralph

## 2017-04-10 NOTE — Progress Notes (Addendum)
    Subjective:  Warren White is a 83 y.o. male who presents today with a chief complaint of HFrEF and to establish care.   HPI: HFrEF, Chronic problem, stable Currently on Lasix 40 mg twice a day, losartan 50 mg daily, metoprolol succinate 12.5 mg daily, and spironolactone 25 mg daily. Compliant with this regimen without side effects. He has been cutting down his sodium intake. He has been using more potassium chloride on his foods. He is up about 6 pounds over the last few weeks. No shortness of breath. Mild lower extremity edema.  COPD, Chronic problem, stable Currently on anoro elipta. He still smokes his pipe several times daily. He is having more cough over the last few weeks. No worsening shortness of breath. He did have one episode in which he coughed up blackish-appearing phlegm a couple of weeks ago. No fevers. No wheezing.   Skin Lesion, New problem Noticed about a month ago. Located on his left ring finger near his DIP joint. No treatment tried. No obvious precipitating events. No pain. No drainage or bleeding.  ROS: Per HPI  PMH: Smoking history reviewed. Current smoker.    Objective:  Physical Exam: BP 140/80   Pulse 81   Ht  (1.727 m)   Wt 200 lb 12.8 oz (91.1 kg)   SpO2 93%   BMI 30.53 kg/m   Gen: NAD, resting comfortably CV: RRR with no murmurs appreciated Pulm: NWOB, CTAB with no crackles, wheezes, or rhonchi MSK: 1+ edema to mid shins bilaterally Skin: Small 2-80mm cystic lesion on left ring finger above DIP joint. Neuro: Grossly normal, moves all extremities Psych: Normal affect and thought content  No results found. Cryotherapy Procedure Note  Pre-operative Diagnosis: Suspicious lesion  Post-operative Diagnosis: Suspicious lesion  Locations: left hand  Indications: Therapeutic  Patient informed of risks (permanent scarring, infection, light or dark discoloration, bleeding, infection, weakness, numbness and recurrence of the lesion) and  benefits of the procedure and verbal informed consent obtained.  The areas are treated with liquid nitrogen therapy, frozen until ice ball extended 2 mm beyond lesion, allowed to thaw, and treated again. The patient tolerated procedure well.  The patient was instructed on post-op care, warned that there may be blister formation, redness and pain. Recommend OTC analgesia as needed for pain.  Assessment/Plan:  HFrEF (heart failure with reduced ejection fraction) (HCC) Mild pretibial edema. He is up about 6 pounds. CXR without volume overload today. Advised him to take an extra dose of Lasix for the next couple of days and to weigh himself daily. Otherwise continue current regimen. Has cardiology follow-up in 3 days. Advised continued low-sodium diet. Return precautions reviewed.  COPD (chronic obstructive pulmonary disease) (HCC) Stable. Continue Anoro and albuterol as needed. Pulmonology referral placed today. Urged smoking cessation today. Return precautions reviewed.   Abnormal CXR No clinical signs of pneumonia. Repeat CXR in 3-4 weeks.   Skin Lesion Unclear etiology - appears to be cystic. Discussed possible aspiration and/or biopsy today however patient deferred. Cryotherapy applied. Return precautions reviewed. Consider attempted aspiration and/or biopsy if no response to cryotherapy.  Preventative Healthcare Flu shot and pneumonia vaccine given today.    Katina Degree. Jimmey Ralph, MD 04/24/2017 11:22 AM

## 2017-04-10 NOTE — Assessment & Plan Note (Signed)
Stable. Continue Anoro and albuterol as needed. Pulmonology referral placed today. Urged smoking cessation today. Return precautions reviewed.

## 2017-04-10 NOTE — Assessment & Plan Note (Addendum)
Mild pretibial edema. He is up about 6 pounds. CXR without volume overload today. Advised him to take an extra dose of Lasix for the next couple of days and to weigh himself daily. Otherwise continue current regimen. Has cardiology follow-up in 3 days. Advised continued low-sodium diet. Return precautions reviewed.

## 2017-04-13 ENCOUNTER — Encounter: Payer: Self-pay | Admitting: Cardiology

## 2017-04-13 DIAGNOSIS — I119 Hypertensive heart disease without heart failure: Secondary | ICD-10-CM | POA: Diagnosis not present

## 2017-04-13 DIAGNOSIS — K219 Gastro-esophageal reflux disease without esophagitis: Secondary | ICD-10-CM | POA: Diagnosis not present

## 2017-04-13 DIAGNOSIS — E669 Obesity, unspecified: Secondary | ICD-10-CM | POA: Diagnosis not present

## 2017-04-13 DIAGNOSIS — I5022 Chronic systolic (congestive) heart failure: Secondary | ICD-10-CM | POA: Diagnosis not present

## 2017-04-13 DIAGNOSIS — J449 Chronic obstructive pulmonary disease, unspecified: Secondary | ICD-10-CM | POA: Diagnosis not present

## 2017-04-13 DIAGNOSIS — R0602 Shortness of breath: Secondary | ICD-10-CM | POA: Diagnosis not present

## 2017-04-13 DIAGNOSIS — I5021 Acute systolic (congestive) heart failure: Secondary | ICD-10-CM | POA: Diagnosis not present

## 2017-05-15 ENCOUNTER — Ambulatory Visit (INDEPENDENT_AMBULATORY_CARE_PROVIDER_SITE_OTHER): Payer: Medicare Other

## 2017-05-15 ENCOUNTER — Telehealth: Payer: Self-pay | Admitting: Family Medicine

## 2017-05-15 DIAGNOSIS — R918 Other nonspecific abnormal finding of lung field: Secondary | ICD-10-CM | POA: Diagnosis not present

## 2017-05-15 DIAGNOSIS — K219 Gastro-esophageal reflux disease without esophagitis: Secondary | ICD-10-CM | POA: Diagnosis not present

## 2017-05-15 DIAGNOSIS — R05 Cough: Secondary | ICD-10-CM

## 2017-05-15 DIAGNOSIS — I119 Hypertensive heart disease without heart failure: Secondary | ICD-10-CM | POA: Diagnosis not present

## 2017-05-15 DIAGNOSIS — J449 Chronic obstructive pulmonary disease, unspecified: Secondary | ICD-10-CM | POA: Diagnosis not present

## 2017-05-15 DIAGNOSIS — E669 Obesity, unspecified: Secondary | ICD-10-CM | POA: Diagnosis not present

## 2017-05-15 DIAGNOSIS — R059 Cough, unspecified: Secondary | ICD-10-CM

## 2017-05-15 DIAGNOSIS — R0602 Shortness of breath: Secondary | ICD-10-CM | POA: Diagnosis not present

## 2017-05-15 DIAGNOSIS — I5022 Chronic systolic (congestive) heart failure: Secondary | ICD-10-CM | POA: Diagnosis not present

## 2017-05-15 NOTE — Telephone Encounter (Signed)
Pt is aware that order is entered.

## 2017-05-15 NOTE — Telephone Encounter (Signed)
Patient needs an updated chest X-Ray order placed. Please call and notify as soon as possible. Patient would like to come by 11am.

## 2017-05-16 NOTE — Progress Notes (Signed)
Patient's "fullness" is improving in his CXR. No further management needed at this time, unless his symptoms are worsening.  Katina Degreealeb M. Jimmey RalphParker, MD 05/16/2017 9:45 AM

## 2017-06-13 ENCOUNTER — Ambulatory Visit: Payer: Self-pay | Admitting: *Deleted

## 2017-06-13 NOTE — Telephone Encounter (Signed)
Pts daughter called reporting pt with loss of appetite, increased weakness and poor endurance x 2 weeks. Appt made for 12/20. Going out of town Friday. Reason for Disposition . [1] MODERATE weakness (i.e., interferes with work, school, normal activities) AND [2] persists > 3 days  Answer Assessment - Initial Assessment Questions 1. DESCRIPTION: "Describe how you are feeling."     Shivering, no appetite, poor endurance, fatigues easily, nightmares 2. SEVERITY: "How bad is it?"  "Can you stand and walk?"   - MILD - Feels weak or tired, but does not interfere with work, school or normal activities   - MODERATE - Able to stand and walk; weakness interferes with work, school, or normal activities   - SEVERE - Unable to stand or walk    Moderate 3. ONSET:  "When did the weakness begin?"     2 weeks ago 4. CAUSE: "What do you think is causing the weakness?"     Not sure 5. MEDICINES: "Have you recently started a new medicine or had a change in the amount of a medicine?"     "Heart med changed 2 months ago." 6. OTHER SYMPTOMS: "Do you have any other symptoms?" (e.g., chest pain, fever, cough, SOB, vomiting, diarrhea, bleeding)     No 7. PREGNANCY: "Is there any chance you are pregnant?" "When was your last menstrual period?"     N/A  Protocols used: WEAKNESS (GENERALIZED) AND FATIGUE-A-AH

## 2017-06-13 NOTE — Telephone Encounter (Signed)
Please advise as needed

## 2017-06-14 NOTE — Telephone Encounter (Signed)
I attempted to contact the patient to see if he would prefer to see Dr. Durene CalHunter this afternoon.  Unable to reach patient.  Also tried to reach his daughter and the line was disconnected.  Patient scheduled for appointment on 06/15/2017 with Dr. Jimmey RalphParker, and [facility] only [facility] to [facility] Dr. Jimmey RalphParker.

## 2017-06-14 NOTE — Telephone Encounter (Signed)
FYI - Patient on the schedule for 12/20.

## 2017-06-14 NOTE — Telephone Encounter (Signed)
Patient needs to be seen sooner than tomorrow. Talked with Dr Durene CalHunter who would be ok seeing him this afternoon.  Katina Degreealeb M. Jimmey RalphParker, MD 06/14/2017 8:22 AM

## 2017-06-15 ENCOUNTER — Ambulatory Visit (INDEPENDENT_AMBULATORY_CARE_PROVIDER_SITE_OTHER): Payer: Medicare Other

## 2017-06-15 ENCOUNTER — Ambulatory Visit (INDEPENDENT_AMBULATORY_CARE_PROVIDER_SITE_OTHER): Payer: Medicare Other | Admitting: Family Medicine

## 2017-06-15 ENCOUNTER — Encounter: Payer: Self-pay | Admitting: Family Medicine

## 2017-06-15 ENCOUNTER — Ambulatory Visit: Payer: Medicare Other

## 2017-06-15 VITALS — BP 118/74 | HR 86 | Temp 98.2°F | Ht 68.0 in | Wt 202.8 lb

## 2017-06-15 DIAGNOSIS — J984 Other disorders of lung: Secondary | ICD-10-CM | POA: Diagnosis not present

## 2017-06-15 DIAGNOSIS — J189 Pneumonia, unspecified organism: Secondary | ICD-10-CM

## 2017-06-15 DIAGNOSIS — R05 Cough: Secondary | ICD-10-CM

## 2017-06-15 DIAGNOSIS — R059 Cough, unspecified: Secondary | ICD-10-CM

## 2017-06-15 DIAGNOSIS — J181 Lobar pneumonia, unspecified organism: Secondary | ICD-10-CM

## 2017-06-15 MED ORDER — ALBUTEROL SULFATE (2.5 MG/3ML) 0.083% IN NEBU
2.5000 mg | INHALATION_SOLUTION | Freq: Once | RESPIRATORY_TRACT | Status: AC
  Administered 2017-06-15: 2.5 mg via RESPIRATORY_TRACT

## 2017-06-15 MED ORDER — METHYLPREDNISOLONE ACETATE 80 MG/ML IJ SUSP
80.0000 mg | Freq: Once | INTRAMUSCULAR | Status: AC
  Administered 2017-06-15: 80 mg via INTRAMUSCULAR

## 2017-06-15 MED ORDER — AZITHROMYCIN 250 MG PO TABS: ORAL_TABLET | ORAL | 0 refills | Status: DC

## 2017-06-15 MED ORDER — CEFTRIAXONE SODIUM 1 G IJ SOLR
1.0000 g | Freq: Once | INTRAMUSCULAR | Status: AC
  Administered 2017-06-15: 1 g via INTRAMUSCULAR

## 2017-06-15 MED ORDER — ALBUTEROL SULFATE (2.5 MG/3ML) 0.083% IN NEBU: 5.0000 mg | INHALATION_SOLUTION | RESPIRATORY_TRACT | 0 refills | Status: DC | PRN

## 2017-06-15 NOTE — Progress Notes (Signed)
    Subjective:  Warren White is a 83 y.o. male who presents today with a chief complaint of cough.   HPI:  Cough, acute issue Symptoms started about 2 weeks ago.  Symptoms worsened over the last couple days.  Associated symptoms include fever, rigors, generalized malaise, lethargy, decreased appetite, chills, and decreased energy level.  He is also having some pain to the lower left part of his chest.  He is concerned that he "slippe a rib".  No home treatments tried.  No obvious precipitating events.  Noticed that symptoms are little bit better with leaning forward, otherwise does not notice any other alleviating or aggravating factors.  ROS: Per HPI  PMH: Smoking history reviewed.  Current smoker.   Objective:  Physical Exam: BP 118/74 (BP Location: Left Arm, Patient Position: Sitting, Cuff Size: Normal)   Pulse 86   Temp 98.2 F (36.8 C) (Oral)   Ht 5\' 8"  (1.727 m)   Wt 202 lb 12.8 oz (92 kg)   SpO2 90%   BMI 30.84 kg/m   Gen: NAD, resting comfortably, sitting in chair, speaking in full sentences CV: RRR with no murmurs appreciated Pulm: Diffuse wheezes noted.  Crackles and rhonchi noted throughout, more significantly located in left lower base.  Normal work of breathing.  Able to speak in full sentences.  Dg Chest 1 View  Result Date: 06/15/2017 CLINICAL DATA:  Cough, shortness of breath and fever for 1-2 weeks. EXAM: CHEST 1 VIEW COMPARISON:  05/15/2017 and prior radiograph FINDINGS: New airspace disease/ consolidation within the mid and lower left lung noted, compatible with pneumonia. Upper limits normal heart size again noted. There is no evidence of pleural effusion or pneumothorax. IMPRESSION: Mid-lower left lung airspace disease compatible with pneumonia. Electronically Signed   By: Harmon PierJeffrey  Hu M.D.   On: 06/15/2017 16:47   Assessment/Plan:  Community acquired pneumonia Respiratory exam overall stable.  While in office gave him a breathing treatment with modest  improvement in his symptoms.  We will give 1 g of Rocephin today.  Start azithromycin for 5-day course.  Also give 80 mg IM Depo-Medrol today.  I do not think that he needs hospitalization-his respiratory exam is overall stable.  Advised patient to continue using albuterol neb every 4-6 hours over the next couple of days.  He will follow-up with me in the next 3-4 days or sooner if symptoms do not improve.  Strict return precautions reviewed and discussed reasons to return to care emergently including severe shortness of breath, chest pain, severe fever, or chills.  Katina Degreealeb M. Jimmey RalphParker, MD 06/15/2017 5:23 PM

## 2017-06-15 NOTE — Patient Instructions (Signed)
You have pneumonia.  Start the azithromycin.  Use the albuterol every 4-6 hours over the next few days.  Come back to see me early next week.  Take care, Dr Jimmey RalphParker

## 2017-06-21 ENCOUNTER — Ambulatory Visit: Payer: Medicare Other | Admitting: Family Medicine

## 2017-06-27 HISTORY — PX: TRANSTHORACIC ECHOCARDIOGRAM: SHX275

## 2017-07-03 DIAGNOSIS — I5032 Chronic diastolic (congestive) heart failure: Secondary | ICD-10-CM | POA: Diagnosis not present

## 2017-07-03 DIAGNOSIS — E669 Obesity, unspecified: Secondary | ICD-10-CM | POA: Diagnosis not present

## 2017-07-03 DIAGNOSIS — J449 Chronic obstructive pulmonary disease, unspecified: Secondary | ICD-10-CM | POA: Diagnosis not present

## 2017-07-03 DIAGNOSIS — K219 Gastro-esophageal reflux disease without esophagitis: Secondary | ICD-10-CM | POA: Diagnosis not present

## 2017-07-03 DIAGNOSIS — I119 Hypertensive heart disease without heart failure: Secondary | ICD-10-CM | POA: Diagnosis not present

## 2017-07-03 DIAGNOSIS — I5022 Chronic systolic (congestive) heart failure: Secondary | ICD-10-CM | POA: Diagnosis not present

## 2017-07-03 DIAGNOSIS — R0602 Shortness of breath: Secondary | ICD-10-CM | POA: Diagnosis not present

## 2017-07-05 ENCOUNTER — Ambulatory Visit: Payer: Medicare Other | Admitting: Family Medicine

## 2017-07-10 ENCOUNTER — Institutional Professional Consult (permissible substitution): Payer: Medicare Other | Admitting: Pulmonary Disease

## 2017-07-11 ENCOUNTER — Telehealth: Payer: Self-pay | Admitting: Family Medicine

## 2017-07-11 NOTE — Telephone Encounter (Signed)
Please contact patient if needed. I do not see any note to advise.   Copied from CRM [phone]. Topic: Quick Communication - Office Called Patient >> Jul 11, 2017  3:39 PM Oneal GroutSebastian, Jennifer S wrote: Reason for CRM: Office called patient. Please advise

## 2017-07-12 NOTE — Telephone Encounter (Signed)
I am not aware of anyone from our office attempting to call the patient.

## 2017-07-26 ENCOUNTER — Ambulatory Visit (INDEPENDENT_AMBULATORY_CARE_PROVIDER_SITE_OTHER): Payer: Medicare Other | Admitting: Pulmonary Disease

## 2017-07-26 ENCOUNTER — Encounter: Payer: Self-pay | Admitting: Pulmonary Disease

## 2017-07-26 ENCOUNTER — Ambulatory Visit (INDEPENDENT_AMBULATORY_CARE_PROVIDER_SITE_OTHER)
Admission: RE | Admit: 2017-07-26 | Discharge: 2017-07-26 | Disposition: A | Discharge status: Home / Self Care | Payer: Medicare Other | Source: Ambulatory Visit | Attending: Pulmonary Disease | Admitting: Pulmonary Disease

## 2017-07-26 VITALS — BP 124/78 | HR 77 | Ht 68.0 in | Wt 196.0 lb

## 2017-07-26 DIAGNOSIS — J189 Pneumonia, unspecified organism: Secondary | ICD-10-CM | POA: Diagnosis not present

## 2017-07-26 DIAGNOSIS — J449 Chronic obstructive pulmonary disease, unspecified: Secondary | ICD-10-CM | POA: Diagnosis not present

## 2017-07-26 MED ORDER — IPRATROPIUM-ALBUTEROL 0.5-2.5 (3) MG/3ML IN SOLN: 3.0000 mL | Freq: Three times a day (TID) | RESPIRATORY_TRACT | 3 refills | Status: DC

## 2017-07-26 NOTE — Patient Instructions (Signed)
We will get a spirometry and a chest x-ray today It is okay to stop the anoro.  We will start you on duo nebs 3 times a day Please use the Mucinex 600 mg twice daily and flutter wall 3 times daily for clearance of secretion Follow-up in 3 months

## 2017-07-26 NOTE — Progress Notes (Signed)
Warren White    [id number]    [birth year]  Primary Care Physician:Parker, Katina Degree, MD  Referring Physician: Ardith Dark, MD [address] [place], [place] [address]  Chief complaint: Follow-up for COPD, recurrent pneumonia  HPI: 84 year old with past medical history of COPD, emphysema, GERD. He hasn't followed up with a doctor in a while. Admitted on 02/02/17 with 2-3 weeks of worsening dyspnea, cough, sputum production, wheezing. Noted to be hypoxic to 85% on room air. He has been started on nebulizers, antibiotics, steroids. He initially was on BiPAP in stepdown. He was eventually transitioned to a floor bed, He used to have a fairly active lifestyle with swimming and golf before this admission but reports gradually worsening dyspnea on exertion and dyspnea at rest with chronic cough, sputum production, wheezing over the past few years. He was given an inhaler by his primary care. Ago but this did not help. He does not recall the name of this inhaler.  Also noted to have cardiomegaly with echocardiogram showing EF 35-40 percent on this admission. He has been started on diuresis. But he still continues to be dyspneic with wheezing. PCCM consulted for further management.  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 He continues to smoke.  Interim history: Since his discharge from the hospital he had another episode of pneumonia in December.  Treated with azithromycin and steroids Has chronic congestion with cough.  He is on anoro but feels that it is actually making his breathing worse Still smoking cigars but does not want to quit.  Outpatient Encounter Medications as of 07/26/2017  Medication Sig  . albuterol (PROVENTIL HFA;VENTOLIN HFA) 108 (90 Base) MCG/ACT inhaler Inhale 2 puffs into the lungs every 6 (six)  hours as needed for wheezing or shortness of breath.  Marland Kitchen albuterol (PROVENTIL) (2.5 MG/3ML) 0.083% nebulizer solution Take 6 mLs (5 mg total) by nebulization every 4 (four) hours as needed for wheezing or shortness of breath.  Ailene Ards ELLIPTA 62.5-25 MCG/INH AEPB INHALE 1 PUFF ONCE DAILY  . cholecalciferol (VITAMIN D) 1000 units tablet Take 1,000 Units by mouth every Monday, Wednesday, and Friday.  . Cyanocobalamin (VITAMIN B-12 PO) Take 1 tablet by mouth every Monday, Wednesday, and Friday.  . Ferrous Sulfate (IRON) 325 (65 Fe) MG TABS Take 1 tablet by mouth every Monday, Wednesday, and Friday.  . furosemide (LASIX) 40 MG tablet Take 1 tablet (40 mg total) by mouth 2 (two) times daily.  Marland Kitchen glucosamine-chondroitin 500-400 MG tablet Take 3 tablets by mouth every Monday, Wednesday, and Friday.  Marland Kitchen MAGNESIUM PO Take 1 tablet by mouth every Monday, Wednesday, and Friday.  . metoprolol succinate (TOPROL-XL) 25 MG 24 hr tablet Take 0.5 tablets (12.5 mg total) by mouth daily.  . Multiple Vitamin (MULTIVITAMIN WITH MINERALS) TABS tablet Take 1 tablet by mouth every Monday, Wednesday, and Friday.  . naproxen sodium (ANAPROX) 220 MG tablet Take 440 mg by mouth 2 (two) times a week.   . Omega-3 Fatty Acids (FISH OIL) 1000 MG CAPS Take 1 capsule by mouth every Monday, Wednesday, and Friday.  . pantoprazole (PROTONIX) 40 MG tablet Take 1 tablet (40 mg total) by mouth every evening.  . pramipexole (MIRAPEX) 0.5 MG tablet Take 0.5 tablets (0.25 mg total) by mouth 2 (two) times daily. Takes twice a  day but can take another 0.25 mg if he needs it  . Sacubitril-Valsartan (ENTRESTO PO) Take 2 tablets by mouth 2 (two) times daily.  . traZODone (DESYREL) 100 MG tablet Take 2 tablets (200 mg total) by mouth at bedtime.  . vitamin C (ASCORBIC ACID) 500 MG tablet Take 500 mg by mouth every Monday, Wednesday, and Friday.  . [DISCONTINUED] azithromycin (ZITHROMAX) 250 MG tablet Take 2 tabs day 1, then 1 tab daily  .  [DISCONTINUED] guaiFENesin (MUCINEX) 600 MG 12 hr tablet Take 1 tablet (600 mg total) by mouth 2 (two) times daily.  . [DISCONTINUED] losartan (COZAAR) 50 MG tablet Take 1 tablet (50 mg total) by mouth daily.  . [DISCONTINUED] spironolactone (ALDACTONE) 25 MG tablet Take 1 tablet (25 mg total) by mouth daily.   No facility-administered encounter medications on file as of 07/26/2017.     Allergies as of 07/26/2017  . (No Known Allergies)    Past Medical History:  Diagnosis Date  . COPD (chronic obstructive pulmonary disease) (HCC)   . Emphysema of lung (HCC)   . GERD (gastroesophageal reflux disease)   . Psoriasis   . Restless leg     Past Surgical History:  Procedure Laterality Date  . BACK SURGERY    . CHOLECYSTECTOMY    . ROTATOR CUFF REPAIR Bilateral   . TOTAL HIP ARTHROPLASTY Left     Family History  Problem Relation Age of Onset  . Restless legs syndrome Mother   . Cataracts Mother     Social History   Socioeconomic History  . Marital status: Married    Spouse name: Not on file  . Number of children: Not on file  . Years of education: Not on file  . Highest education level: Not on file  Social Needs  . Financial resource strain: Not on file  . Food insecurity - worry: Not on file  . Food insecurity - inability: Not on file  . Transportation needs - medical: Not on file  . Transportation needs - non-medical: Not on file  Occupational History  . Not on file  Tobacco Use  . Smoking status: Current Every Day Smoker    Types: Pipe  . Smokeless tobacco: Never Used  Substance and Sexual Activity  . Alcohol use: Yes    Alcohol/week: 7.0 oz    Types: 14 Standard drinks or equivalent per week    Comment: occ  . Drug use: No  . Sexual activity: No  Other Topics Concern  . Not on file  Social History Narrative  . Not on file    Review of systems: Review of Systems  Constitutional: Negative for fever and chills.  HENT: Negative.   Eyes: Negative for  blurred vision.  Respiratory: as per HPI  Cardiovascular: Negative for chest pain and palpitations.  Gastrointestinal: Negative for vomiting, diarrhea, blood per rectum. Genitourinary: Negative for dysuria, urgency, frequency and hematuria.  Musculoskeletal: Negative for myalgias, back pain and joint pain.  Skin: Negative for itching and rash.  Neurological: Negative for dizziness, tremors, focal weakness, seizures and loss of consciousness.  Endo/Heme/Allergies: Negative for environmental allergies.  Psychiatric/Behavioral: Negative for depression, suicidal ideas and hallucinations.  All other systems reviewed and are negative.  Physical Exam: Blood pressure 124/78, pulse 77, height 5\' 8"  (1.727 m), weight 196 lb (88.9 kg), SpO2 92 %. Gen:      No acute distress HEENT:  EOMI, sclera anicteric Neck:     No masses; no thyromegaly Lungs:    Clear  to auscultation bilaterally; normal respiratory effort CV:         Regular rate and rhythm; no murmurs Abd:      + bowel sounds; soft, non-tender; no palpable masses, no distension Ext:    No edema; adequate peripheral perfusion Skin:      Warm and dry; no rash Neuro: alert and oriented x 3 Psych: normal mood and affect  Data Reviewed: Chest x-ray 02/02/17- cardiomegaly, hyperinflated lungs with mild bronchitis changes. Chest x-ray 04/10/17-lower lobe segmental atelectasis Chest x-ray 05/15/17-improvement in left base opacity Chest x-ray 06/15/17- new left midlung and lower lobe consolidation  I reviewed the images personally.  Echocardiogram 02/03/17 > LVEF 35-40 percent, diffuse hypokinesis, grade 1 diastolic dysfunction  Spirometry 07/26/17 FVC 2.04 [2%], FEV1 1.05 [47%], F/F 51 Severe airway obstruction  Assessment:  Severe COPD, chronic bronchitis He is recovering from recent episode of pneumonia.  I will get a chest x-ray today to make sure the infiltrate is clearing up. Discussed smoking cessation but he does not want to stop  cigars Encouraged him to use Mucinex and flutter valve for clearance of secretion He wants to get off inhalers as they do not seem to be helping.  I will stop anoro and given duo nebs 3 times daily.  Code status discussed.  He wants to be DNR.  He would an appropriate candidate for a palliative care approach but he is not interested in Care[facility] connects program  Plan/Recommendations: - Stop anoro, start duonebs tid - Mucinex, fluter valve - Chest x ray  Chilton Greathouse MD [facility] Pager [phone] 07/26/2017, 12:09 PM  CC: Ardith Dark, MD

## 2017-07-28 ENCOUNTER — Telehealth: Payer: Self-pay | Admitting: Pulmonary Disease

## 2017-07-28 MED ORDER — IPRATROPIUM-ALBUTEROL 0.5-2.5 (3) MG/3ML IN SOLN: 3.0000 mL | Freq: Three times a day (TID) | RESPIRATORY_TRACT | 3 refills | Status: DC

## 2017-07-28 NOTE — Telephone Encounter (Addendum)
Spoke with [place], she states the [place] [facility] and [place] not [place] [facility]. [facility] would [facility]. Advised Warren White to call if this does not work. She agreed. Nothing further is needed.

## 2017-08-01 ENCOUNTER — Telehealth: Payer: Self-pay | Admitting: Pulmonary Disease

## 2017-08-01 DIAGNOSIS — J189 Pneumonia, unspecified organism: Secondary | ICD-10-CM

## 2017-08-01 NOTE — Telephone Encounter (Signed)
Notes recorded by Chilton GreathouseMannam, Praveen, MD on 08/01/2017 at 9:36 AM EST CXR shows pneumonia is better. Please get follow up CXR on day of return visit. Thanks.  Pt is aware of results/recommendations and voiced his understanding.  Pt request that I contact his daughter, Sterling BigMaya and relay results.  [name] is aware of results and voiced his understanding.  Nothing further is needed.

## 2017-08-02 ENCOUNTER — Telehealth: Payer: Self-pay | Admitting: Pulmonary Disease

## 2017-08-02 ENCOUNTER — Telehealth: Payer: Self-pay | Admitting: Family Medicine

## 2017-08-02 DIAGNOSIS — J449 Chronic obstructive pulmonary disease, unspecified: Secondary | ICD-10-CM

## 2017-08-02 NOTE — Telephone Encounter (Signed)
Called and spoke with pt's daughter, Sterling BigMaya.  [name] states pt picked up Duoneb today, but does not have neb machine.  [name]'s request [facility] neb machine.  Nothing further is needed.   Plan/Recommendations: - Stop anoro, start duonebs tid - Mucinex, fluter valve - Chest x ray

## 2017-08-02 NOTE — Telephone Encounter (Signed)
Copied from CRM [phone]. Topic: Inquiry >> Aug 02, 2017 10:33 AM Yvonna Alanisobinson, Andra M wrote: Reason for CRM: Patient's daughter Sterling BigMaya called stating that her father's Nebulizer has broken, and that he needs a new one ASAP. Patient's daughter also states that he cannot take his medication with out the Nebulizer. Please call [name] at [phone].   Thank You!!!

## 2017-08-02 NOTE — Telephone Encounter (Signed)
Spoke with patient's daughter and recommended that she contact patient's pulmonologist.  Daughter verbalized understanding and stated she thought that was who she had called.  Stated she would contact them regarding the nebulizer.  Advised her to call back if she had any difficulty.  She verbalized understanding.

## 2017-08-23 DIAGNOSIS — M1712 Unilateral primary osteoarthritis, left knee: Secondary | ICD-10-CM | POA: Diagnosis not present

## 2017-08-23 DIAGNOSIS — M6740 Ganglion, unspecified site: Secondary | ICD-10-CM | POA: Diagnosis not present

## 2017-08-23 DIAGNOSIS — R2232 Localized swelling, mass and lump, left upper limb: Secondary | ICD-10-CM | POA: Diagnosis not present

## 2017-08-25 ENCOUNTER — Other Ambulatory Visit: Payer: Self-pay | Admitting: Orthopedic Surgery

## 2017-09-05 ENCOUNTER — Ambulatory Visit (HOSPITAL_BASED_OUTPATIENT_CLINIC_OR_DEPARTMENT_OTHER): Admission: RE | Admit: 2017-09-05 | Payer: Medicare Other | Source: Ambulatory Visit | Admitting: Orthopedic Surgery

## 2017-09-05 ENCOUNTER — Encounter (HOSPITAL_BASED_OUTPATIENT_CLINIC_OR_DEPARTMENT_OTHER): Admission: RE | Payer: Self-pay | Source: Ambulatory Visit

## 2017-09-05 SURGERY — EXCISION MASS
Anesthesia: Regional | Site: Middle Finger | Laterality: Left

## 2017-10-02 ENCOUNTER — Encounter: Payer: Self-pay | Admitting: Cardiology

## 2017-10-02 DIAGNOSIS — R0602 Shortness of breath: Secondary | ICD-10-CM | POA: Diagnosis not present

## 2017-10-02 DIAGNOSIS — I5032 Chronic diastolic (congestive) heart failure: Secondary | ICD-10-CM

## 2017-10-02 DIAGNOSIS — J449 Chronic obstructive pulmonary disease, unspecified: Secondary | ICD-10-CM | POA: Diagnosis not present

## 2017-10-02 DIAGNOSIS — I119 Hypertensive heart disease without heart failure: Secondary | ICD-10-CM | POA: Diagnosis not present

## 2017-10-02 DIAGNOSIS — E669 Obesity, unspecified: Secondary | ICD-10-CM | POA: Diagnosis not present

## 2017-10-02 DIAGNOSIS — I5022 Chronic systolic (congestive) heart failure: Secondary | ICD-10-CM | POA: Diagnosis not present

## 2017-10-02 DIAGNOSIS — K219 Gastro-esophageal reflux disease without esophagitis: Secondary | ICD-10-CM | POA: Diagnosis not present

## 2017-10-23 ENCOUNTER — Ambulatory Visit (INDEPENDENT_AMBULATORY_CARE_PROVIDER_SITE_OTHER)
Admission: RE | Admit: 2017-10-23 | Discharge: 2017-10-23 | Disposition: A | Discharge status: Home / Self Care | Payer: Medicare Other | Source: Ambulatory Visit | Attending: Pulmonary Disease | Admitting: Pulmonary Disease

## 2017-10-23 ENCOUNTER — Encounter: Payer: Self-pay | Admitting: Pulmonary Disease

## 2017-10-23 ENCOUNTER — Ambulatory Visit (INDEPENDENT_AMBULATORY_CARE_PROVIDER_SITE_OTHER): Payer: Medicare Other | Admitting: Pulmonary Disease

## 2017-10-23 VITALS — BP 124/70 | HR 65 | Ht 68.0 in | Wt 188.5 lb

## 2017-10-23 DIAGNOSIS — J449 Chronic obstructive pulmonary disease, unspecified: Secondary | ICD-10-CM | POA: Diagnosis not present

## 2017-10-23 DIAGNOSIS — J189 Pneumonia, unspecified organism: Secondary | ICD-10-CM | POA: Diagnosis not present

## 2017-10-23 NOTE — Progress Notes (Addendum)
Warren White    [id number]    [birth year]  Primary Care Physician:Parker, Katina Degree, MD  Referring Physician: Ardith Dark, MD [address] [place], [place] [address]  Chief complaint: Follow-up for COPD, recurrent pneumonia  HPI: 84 year old with past medical history of COPD, emphysema, GERD. He hasn't followed up with a doctor in a while. Admitted on 02/02/17 with 2-3 weeks of worsening dyspnea, cough, sputum production, wheezing. Noted to be hypoxic to 85% on room air. He has been started on nebulizers, antibiotics, steroids. He initially was on BiPAP in stepdown. He was eventually transitioned to a floor bed, He used to have a fairly active lifestyle with swimming and golf before this admission but reports gradually worsening dyspnea on exertion and dyspnea at rest with chronic cough, sputum production, wheezing over the past few years. He was given an inhaler by his primary care. Ago but this did not help. He does not recall the name of this inhaler.  Also noted to have cardiomegaly with echocardiogram showing EF 35-40 percent on this admission. He has been started on diuresis.  Since his discharge from the hospital he had another episode of pneumonia in December 2019.  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 He continues to smoke.  Interim history: He is doing well since last visit.  Continues on DuoNeb which he is using once a day. Remains active with swimming and golf.  Still smoking a pipe a day with no interest in quitting.  Outpatient Encounter Medications as of 10/23/2017  Medication Sig  . albuterol (PROVENTIL HFA;VENTOLIN HFA) 108 (90 Base) MCG/ACT inhaler Inhale 2 puffs into the lungs every 6 (six) hours as needed for wheezing or shortness of breath.  Ailene Ards ELLIPTA 62.5-25 MCG/INH AEPB INHALE 1 PUFF ONCE DAILY  . cholecalciferol (VITAMIN D) 1000 units tablet Take 1,000 Units by mouth every Monday, Wednesday, and Friday.  . Cyanocobalamin (VITAMIN B-12 PO) Take 1 tablet by mouth every Monday, Wednesday, and Friday.  Marland Kitchen dextromethorphan-guaiFENesin (MUCINEX DM) 30-600 MG 12hr tablet Take 1 tablet by mouth 2 (two) times daily.  . Ferrous Sulfate (IRON) 325 (65 Fe) MG TABS Take 1 tablet by mouth every Monday, Wednesday, and Friday.  . furosemide (LASIX) 40 MG tablet Take 1 tablet (40 mg total) by mouth 2 (two) times daily. (Patient taking differently: Take 40 mg by mouth daily. )  . glucosamine-chondroitin 500-400 MG tablet Take 3 tablets by mouth every Monday, Wednesday, and Friday.  Marland Kitchen ipratropium-albuterol (DUONEB) 0.5-2.5 (3) MG/3ML SOLN Take 3 mLs by nebulization 3 (three) times daily. DX: J44.9  . MAGNESIUM PO Take 1 tablet by mouth every Monday, Wednesday, and Friday.  . metoprolol succinate (TOPROL-XL) 25 MG 24 hr tablet Take 0.5 tablets (12.5 mg total) by mouth daily.  . Multiple Vitamin (MULTIVITAMIN WITH MINERALS) TABS tablet Take 1 tablet by mouth every Monday, Wednesday, and Friday.  . naproxen sodium (ANAPROX) 220 MG tablet Take 440 mg by mouth 2 (two) times a week.   . Omega-3 Fatty Acids (FISH OIL) 1000 MG CAPS Take 1 capsule by mouth every Monday, Wednesday, and Friday.  . pantoprazole (PROTONIX) 40 MG tablet Take 1 tablet (40 mg total) by mouth every evening.  . pramipexole (MIRAPEX) 0.5 MG tablet Take 0.5  tablets (0.25 mg total) by mouth 2 (two) times daily. Takes twice a day but can take another 0.25 mg if he needs it  . Sacubitril-Valsartan (ENTRESTO PO) Take 2 tablets by mouth 2 (two) times daily.  . traZODone (DESYREL) 100 MG tablet Take 2 tablets (200 mg total) by mouth at bedtime.  . vitamin C (ASCORBIC ACID) 500 MG tablet Take 500 mg by mouth every Monday, Wednesday, and Friday.  . [DISCONTINUED] albuterol (PROVENTIL) (2.5  MG/3ML) 0.083% nebulizer solution Take 6 mLs (5 mg total) by nebulization every 4 (four) hours as needed for wheezing or shortness of breath.   No facility-administered encounter medications on file as of 10/23/2017.     Allergies as of 10/23/2017  . (No Known Allergies)    Past Medical History:  Diagnosis Date  . COPD (chronic obstructive pulmonary disease) (HCC)   . Emphysema of lung (HCC)   . GERD (gastroesophageal reflux disease)   . Psoriasis   . Restless leg     Past Surgical History:  Procedure Laterality Date  . BACK SURGERY    . CHOLECYSTECTOMY    . ROTATOR CUFF REPAIR Bilateral   . TOTAL HIP ARTHROPLASTY Left     Family History  Problem Relation Age of Onset  . Restless legs syndrome Mother   . Cataracts Mother     Social History   Socioeconomic History  . Marital status: Married    Spouse name: Not on file  . Number of children: Not on file  . Years of education: Not on file  . Highest education level: Not on file  Occupational History  . Not on file  Social Needs  . Financial resource strain: Not on file  . Food insecurity:    Worry: Not on file    Inability: Not on file  . Transportation needs:    Medical: Not on file    Non-medical: Not on file  Tobacco Use  . Smoking status: Current Every Day Smoker    Types: Pipe  . Smokeless tobacco: Never Used  Substance and Sexual Activity  . Alcohol use: Yes    Alcohol/week: 7.0 oz    Types: 14 Standard drinks or equivalent per week    Comment: occ  . Drug use: No  . Sexual activity: Never  Lifestyle  . Physical activity:    Days per week: Not on file    Minutes per session: Not on file  . Stress: Not on file  Relationships  . Social connections:    Talks on phone: Not on file    Gets together: Not on file    Attends religious service: Not on file    Active member of club or organization: Not on file    Attends meetings of clubs or organizations: Not on file    Relationship status: Not on  file  . Intimate partner violence:    Fear of current or ex partner: Not on file    Emotionally abused: Not on file    Physically abused: Not on file    Forced sexual activity: Not on file  Other Topics Concern  . Not on file  Social History Narrative  . Not on file    Review of systems: Review of Systems  Constitutional: Negative for fever and chills.  HENT: Negative.   Eyes: Negative for blurred vision.  Respiratory: as per HPI  Cardiovascular: Negative for chest pain and palpitations.  Gastrointestinal: Negative for vomiting, diarrhea, blood per rectum. Genitourinary: Negative for dysuria,  urgency, frequency and hematuria.  Musculoskeletal: Negative for myalgias, back pain and joint pain.  Skin: Negative for itching and rash.  Neurological: Negative for dizziness, tremors, focal weakness, seizures and loss of consciousness.  Endo/Heme/Allergies: Negative for environmental allergies.  Psychiatric/Behavioral: Negative for depression, suicidal ideas and hallucinations.  All other systems reviewed and are negative.  Physical Exam: Blood pressure 124/70, pulse 65, height  (1.727 m), weight 188 lb 8 oz (85.5 kg), SpO2 94 %. Gen:      No acute distress HEENT:  EOMI, sclera anicteric Neck:     No masses; no thyromegaly Lungs:    Clear to auscultation bilaterally; normal respiratory effort CV:         Regular rate and rhythm; no murmurs Abd:      + bowel sounds; soft, non-tender; no palpable masses, no distension Ext:    No edema; adequate peripheral perfusion Skin:      Warm and dry; no rash Neuro: alert and oriented x 3 Psych: normal mood and affect   Data Reviewed: Chest x-ray 02/02/17- cardiomegaly, hyperinflated lungs with mild bronchitis changes. Chest x-ray 04/10/17-lower lobe segmental atelectasis Chest x-ray 05/15/17-improvement in left base opacity Chest x-ray 06/15/17- new left midlung and lower lobe consolidation Chest x-ray1/30/2019-improving left lung  consolidation Chest x-ray 10/23/2017- No residual pneumonia I reviewed the images personally  Echocardiogram 02/03/17 > LVEF 35-40 percent, diffuse hypokinesis, grade 1 diastolic dysfunction  Spirometry 07/26/17 FVC 2.04 [2%], FEV1 1.05 [47%], F/F 51 Severe airway obstruction  Assessment:  Severe COPD, chronic bronchitis Continues on DuoNeb.  He is not interested in trying any inhalers and we are taking him off anoro I have encouraged him to use Mucinex and flutter valve as needed for clearance of secretion Discussed smoking cessation but he does not want to stop cigars.  Code status  He wants to be DNR.    Health maintenance 04/10/2017-influenza 04/10/2017- Prevnar 13  Plan/Recommendations: - Continue duonebs - Mucinex, fluter valve  Chilton Greathouse MD [facility] Pager [phone] 10/23/2017, 3:18 PM  CC: Ardith Dark, MD

## 2017-10-23 NOTE — Patient Instructions (Signed)
Glad you are doing well Continue using the duo nebs Use Mucinex and flutter valve for clearance of secretion Follow-up in 1 year.

## 2018-01-20 DIAGNOSIS — R05 Cough: Secondary | ICD-10-CM | POA: Diagnosis not present

## 2018-01-20 DIAGNOSIS — J441 Chronic obstructive pulmonary disease with (acute) exacerbation: Secondary | ICD-10-CM | POA: Diagnosis not present

## 2018-01-20 DIAGNOSIS — J9811 Atelectasis: Secondary | ICD-10-CM | POA: Diagnosis not present

## 2018-02-05 ENCOUNTER — Other Ambulatory Visit: Payer: Self-pay | Admitting: Family Medicine

## 2018-03-12 ENCOUNTER — Ambulatory Visit (INDEPENDENT_AMBULATORY_CARE_PROVIDER_SITE_OTHER): Payer: Medicare Other | Admitting: Family Medicine

## 2018-03-12 ENCOUNTER — Encounter: Payer: Self-pay | Admitting: Family Medicine

## 2018-03-12 VITALS — BP 150/78 | HR 76 | Temp 97.7°F | Ht 68.0 in | Wt 193.2 lb

## 2018-03-12 DIAGNOSIS — L97519 Non-pressure chronic ulcer of other part of right foot with unspecified severity: Secondary | ICD-10-CM | POA: Diagnosis not present

## 2018-03-12 DIAGNOSIS — L409 Psoriasis, unspecified: Secondary | ICD-10-CM

## 2018-03-12 MED ORDER — MUPIROCIN 2 % EX OINT: TOPICAL_OINTMENT | CUTANEOUS | 0 refills | Status: DC

## 2018-03-12 NOTE — Patient Instructions (Signed)
1) grab hibaclens to wash wound. I would soak the foot in warm water to soften scab then scrub scab. Want it to bleed to bring in oxygen and help it to heal.   2) put on bactroban ointment im sending in 3 times/day. Would cover wound if out and about.   3) elevate leg  4) must get a test on your legs done to make sure you have good blood flow, otherwise we are going to have a hard time getting this to heal.   5) another good wound dressing is mepilex dressing. Can change this every 72 hours since it's hard for you to change dressings.   If not better by Wednesday let us know.

## 2018-03-12 NOTE — Progress Notes (Signed)
Patient: Warren White MRN: [id number] DOB: Jan 03, 1926 PCP: Ardith Dark, MD     Subjective:  Chief Complaint  Patient presents with  . sores on right foot-not healing  . would like rx for Clobetasol    HPI: The patient is a 84 y.o. male who presents today for 2 sores on his right foot. One sore has been on his foot for years and is psoriasis (per patient). The top sore is the new one and has been on his foot for about 3 weeks. He thinks that it is from wearing his golf sandals without socks against his psoriasis. He stopped doing this, but it won't heal. He has put antibiotic cream on it (neosporin). He has been trying to make it dry up. When he goes to sleep, his RLS causes him to move his legs and he rubs the sore against his mattress all night long. He has started to put a bandaid on it, but it doesn't stick. He has no fever/chills. He has no history of diabetes. He has CHF and COPD. He states the wound has had some oozing in it.  Daughter is here and they deny this being a heart failure exacerbation. He has not gained any weight (euvolemic) and they watch this closely. Right Foot is more swollen, with some erythema and tenderness around the wound.   He has poor pedal pulses and his toes are purple/blue. He does occasionally get cramping in his calves. He is a smoker as well.   He is also wanting to know if we will refill his clobetasol cream. He is tired of driving to [place] for his derm for his psoriasis and would like a new referral to someone in [place].    Review of Systems  Constitutional: Negative for chills, fever and unexpected weight change.  Respiratory: Negative for cough (no new cough from baseline ), shortness of breath and wheezing.   Cardiovascular: Positive for leg swelling. Negative for chest pain and palpitations.  Gastrointestinal: Negative for abdominal pain, diarrhea, nausea and vomiting.  Skin: Positive for rash and wound.       2 wounds top of right  foot    Allergies Patient has No Known Allergies.  Past Medical History Patient  has a past medical history of COPD (chronic obstructive pulmonary disease) (HCC), Emphysema of lung (HCC), GERD (gastroesophageal reflux disease), Psoriasis, and Restless leg.  Surgical History Patient  has a past surgical history that includes Rotator cuff repair (Bilateral); Back surgery; Cholecystectomy; and Total hip arthroplasty (Left).  Family History Pateint's family history includes Cataracts in his mother; Restless legs syndrome in his mother.  Social History Patient  reports that he has been smoking pipe. He has never used smokeless tobacco. He reports that he drinks about 14.0 standard drinks of alcohol per week. He reports that he does not use drugs.    Objective: Vitals:   03/12/18 0920  BP: (!) 150/78  Pulse: 76  Temp: 97.7 F (36.5 C)  TempSrc: Oral  SpO2: 96%  Weight: 193 lb 3.2 oz (87.6 kg)  Height: 5\' 8"  (1.727 m)    Body mass index is 29.38 kg/m.  Physical Exam  Constitutional: He is oriented to person, place, and time. He appears well-developed and well-nourished.  Tobacco odor.   Cardiovascular: Normal rate and regular rhythm.  Pedal pulses on right are absent. Toes are light purple in color.   Pulmonary/Chest: Effort normal.  Neurological: He is alert and oriented to person, place, and time.  Skin:  Right foot: a dime sized open wound on medial superior aspect of foot. Minimal surrounding erythema. Has scab/eschar over top of it. Mildly tender to palpation with surrounding pitting edema in foot. Wound below this is dry and healed over. No oozing/weeping or drainage from either lesions.   Vitals reviewed.      Assessment/plan: 1. Ulcer of right foot, unspecified ulcer stage (HCC) Appears to possibly be an arterial ulcer with history/location and absent pulses. We are going to get ABIs on him as soon as possible. Also wonder if there is a venous component with swelling  and what appears to be possible lipodermatosclerosis in early stages. Went over wound care in detail. I want him to clean off scab if he can and get it clean and to bleed some to get oxygen down there to help with the healing. Also discussed mepilex if he doesn't want to change dressing daily. Wound care went over with patient and daughter. Aside from washing and cleaning well will do trial of bactroban which may have limited efficacy. No signs of infection at this point ,but discussed if not getting better we need to have close follow up to ensure is healing and not getting deeper with risk of getting into bone. They are to call or follow up with pcp on Wednesday if not seeing any improvement. Wound care done for patient in office today.  - VAS US ABI WITH/WO TBI; Future  2. Psoriasis Will send to closer dermatologist. Also will see if pcp willing to px his clobetasol for him and will forward chart to dr. Jimmey RalphParker.  - Ambulatory referral to Dermatology      Return if symptoms worsen or fail to improve.    Orland MustardAllison Melanny Wire, MD [facility]   03/12/2018

## 2018-03-13 ENCOUNTER — Other Ambulatory Visit: Payer: Self-pay | Admitting: Family Medicine

## 2018-03-14 ENCOUNTER — Other Ambulatory Visit: Payer: Self-pay | Admitting: Family Medicine

## 2018-03-14 ENCOUNTER — Telehealth: Payer: Self-pay | Admitting: *Deleted

## 2018-03-14 ENCOUNTER — Other Ambulatory Visit: Payer: Self-pay

## 2018-03-14 MED ORDER — CEPHALEXIN 500 MG PO CAPS: 500.0000 mg | ORAL_CAPSULE | Freq: Three times a day (TID) | ORAL | 0 refills | Status: DC

## 2018-03-14 MED ORDER — CLOBETASOL PROPIONATE 0.05 % EX CREA: 1.0000 "application " | TOPICAL_CREAM | Freq: Two times a day (BID) | CUTANEOUS | 3 refills | Status: DC

## 2018-03-14 NOTE — Telephone Encounter (Signed)
[facility] Would like to know if he is to discontinue the first cream that was prescribed and start using the clobetasol cream (TEMOVATE) 0.05 %? Advised patient that Cassie also called to have him schedule a follow up with Dr Jimmey RalphParker, per Dr Artis FlockWolfe. Patient advised that I call [name], patient's daughter, to see when she could bring him ti the appointment.  Called and spoke to [place] and relayed the message that Dr Artis FlockWolfe wanted the patient to follow up with Dr Jimmey RalphParker. [name] states that she thinks it is too soon to schedule a follow up since he just got the antibiotic. States that they will call and schedule a follow up after his blood test.

## 2018-03-14 NOTE — Telephone Encounter (Signed)
Yes, I can do this, but I need him to see Dr. Jimmey RalphParker for f/u on Thursday or Friday as well. Also do they have the ABI scheduled?

## 2018-03-14 NOTE — Telephone Encounter (Signed)
Patient saw Dr. Artis FlockWolfe on 03/12/18.

## 2018-03-14 NOTE — Progress Notes (Signed)
Rx for clobetasol sent to pharmacy.

## 2018-03-14 NOTE — Telephone Encounter (Signed)
Attempted to call patient, left voicemail requesting call back. 

## 2018-03-14 NOTE — Telephone Encounter (Signed)
Copied from CRM [id number]. Topic: General - Other >> Mar 14, 2018 10:04 AM Marylen PontoMcneil, Ja-Kwan wrote: Reason for CRM: Pt daughter Sterling BigMaya states they were told to call back if pt did not get any better and an antibiotic could be called in to the pharmacy. [name] requests that the antibiotic be [facility] [place] - [address] [phone] (Phone)  [phone] (Fax)

## 2018-03-14 NOTE — Telephone Encounter (Signed)
Dr. Artis FlockWolfe-  Please see note and advise  Thanks

## 2018-03-14 NOTE — Telephone Encounter (Signed)
See note

## 2018-03-15 NOTE — Telephone Encounter (Signed)
Called patient's daughter, Warren White.  Received no answer and left voicemail msg requesting call back.  Per dr. Artis FlockWolfe: he needs close follow up to make sure is healing and not getting worse. Concern for arterial ulcer which will not heal with antibiotics if there is poor blood flow. His ABI is not scheduled until 10/02 so he really needs to come in next week for dr. Jimmey RalphParker to look at wound.

## 2018-03-15 NOTE — Telephone Encounter (Signed)
CRM created. 

## 2018-03-19 DIAGNOSIS — Z23 Encounter for immunization: Secondary | ICD-10-CM | POA: Diagnosis not present

## 2018-03-19 NOTE — Telephone Encounter (Signed)
Called and spoke with patient's daughter, Dennie BibleMaya Martin.  She has great concerns with bringing her father in office again after seeing Dr. Artis FlockWolfe.  She refuses to  follow up with Dr. Jimmey RalphParker until after ABI on 10/2.  She was very upset with Dr. Blair HeysWolfe's suggestion to follow up w/Dr. Jimmey RalphParker and feels it is completely unnecessary.    Dr. Vincente LibertyParker-please advise

## 2018-03-20 NOTE — Telephone Encounter (Signed)
Ok with waiting until ABI if his ulcer is not worsening. If symptoms are worsening or changing, needs to be seen.  Katina Degreealeb M. Jimmey RalphParker, MD 03/20/2018 10:46 AM

## 2018-03-21 NOTE — Telephone Encounter (Signed)
Called and spoke with patient's daughter and relayed information.  She was very relieved.  Also verbalized understanding to call if any worsening of patient's ulcer.

## 2018-03-28 ENCOUNTER — Encounter (HOSPITAL_COMMUNITY): Payer: Medicare Other

## 2018-03-29 ENCOUNTER — Other Ambulatory Visit: Payer: Self-pay | Admitting: Family Medicine

## 2018-04-04 ENCOUNTER — Ambulatory Visit (HOSPITAL_COMMUNITY)
Admission: RE | Admit: 2018-04-04 | Discharge: 2018-04-04 | Disposition: A | Discharge status: Home / Self Care | Payer: Medicare Other | Source: Ambulatory Visit | Attending: Internal Medicine | Admitting: Internal Medicine

## 2018-04-04 DIAGNOSIS — L97519 Non-pressure chronic ulcer of other part of right foot with unspecified severity: Secondary | ICD-10-CM

## 2018-04-06 NOTE — Progress Notes (Signed)
Please inform patient of the following:  His ultrasound showed that he has decreased blood flow in both of his legs. This is likely contributing to his ulcer. Would recommend referral to vascular surgery for further evaluation. Please place referral.  Warren Enyeart M. Jimmey Ralph, MD 04/06/2018 3:33 PM

## 2018-04-11 NOTE — Progress Notes (Signed)
Warren White    Date of visit:  10/02/2017 DOB:  [birth year]    Age:  84 yrs. Medical record number:  [id number]     Account number:  [id number] Primary Care Provider: [name] M ____________________________ CURRENT DIAGNOSES  1. Dyspnea  2. Hypertensive heart disease without heart failure  3. COPD  4. Gastro-esophageal reflux disease  5. Obesity  6. Chronic diastolic (congestive) heart failure ____________________________ ALLERGIES  No Known Drug Allergies ____________________________ MEDICATIONS  1. albuterol sulfate 2.5 mg/3 mL (0.083 %) solution for nebulization, PRN  2. albuterol sulfate HFA 90 mcg/actuation aerosol inhaler, PRN  3. Anoro Ellipta 62.5 mcg-25 mcg/actuation powder for inhalation, 1 puff daily  4. cyanocobalamin (vit B-12) 1,000 mcg capsule, MWF  5. Entresto 24 mg-26 mg tablet, one tablet twice a day  6. ferrous sulfate 325 mg (65 mg iron) tablet, MWF  7. furosemide 40 mg tablet, one daily  8. Glucosamine Chondroitin Maximum Strength 500 mg-400 mg capsule, 3 capsules PO MWF  9. guaifenesin ER 600 mg tablet, extended release 12 hr, BID  10. magnesium 250 mg tablet, MWF  11. metoprolol succinate ER 25 mg tablet,extended release 24 hr, 1 p.o. daily  12. Mirapex 0.25 mg tablet, 2 tablets BID  13. multivitamin tablet, 1 p.o. daily  14. naproxen sodium 220 mg tablet, PRN  15. omega 3-dha-epa-fish oil 1,000 mg (120 mg-180 mg) capsule, MWF  16. pantoprazole 40 mg tablet,delayed release, 1 p.o. daily  17. spironolactone 25 mg tablet, 1 p.o. daily  18. trazodone 100 mg tablet, 2 tablets at HS  19. Vitamin C 500 mg tablet, MWF  20. Vitamin D3 1,000 unit tablet, MWF ____________________________ HISTORY OF PRESENT ILLNESS Patient returns for cardiac followup. I received a request for preoperative evaluation in him but he decided not to have surgery on his hand. He is able to play golf and swims and is over his dyspnea now. The coughing appears to be improving some. He  is supposed to followup with a chest x-ray with his pulmonologist soon. He is not really having dyspnea or edema now. He has lost 10 pounds of weight and says that he is voluntarily trying to lose weight. He does not have any chest pain suggestive of angina. Has tolerated the increase in metoprolol well.  ____________________________ PAST HISTORY  Past Medical Illnesses:  hypertension, GERD, BPH, COPD, restless legs;  Cardiovascular Illnesses:  cardiomyopathy(idiopathic);  Infectious Diseases:  no previous history of significant infectious diseases;  Surgical Procedures:  back surgery, hip replacement-bil, shoulder surgery, cornea transplants;  Trauma History:  no previous history of significant trauma;  NYHA Classification:  II;  Canadian Angina Classification:  Class 0: Asymptomatic;  Cardiology Procedures-Invasive:  no previous interventional or invasive cardiology procedures;  Cardiology Procedures-Noninvasive:  echocardiogram January 2019;  Peripheral Vascular Procedures:  no previous invasive peripheral vascular procedures.;  LVEF of 55% documented via echocardiogram on 07/03/2017,   ____________________________ CARDIO-PULMONARY TEST DATES EKG Date:  04/13/2017;  Echocardiography Date: 07/03/2017;   ____________________________ FAMILY HISTORY Father -- Father dead Mother -- Mother dead, Death due to natural cause ____________________________ SOCIAL HISTORY Alcohol Use:  1 glass of wine or mixed drink daily;  Smoking:  smokes a pipe;  Diet:  regular diet;  Lifestyle:  married;  Exercise:  no regular exercise;  Occupation:  retired Camera operator;  Residence:  lives with wife;   ____________________________ REVIEW OF SYSTEMS General:  obesity, malaise and fatigue, weight loss of 10 pounds Respiratory: denies dyspnea, cough, wheezing or hemoptysis. Cardiovascular:  please review HPI Abdominal: history of GERD and heartburn Genitourinary-Male: nocturia, urgency  Musculoskeletal:  chronic low back  pain, arthritis, aching Neurological:  denies headaches, stroke, or TIA  ____________________________ PHYSICAL EXAMINATION VITAL SIGNS  Blood Pressure:  106/70 Sitting, Right arm, large cuff  , 118/76 Standing, Right arm and large cuff   Pulse:  80/min. Weight:  189.50 lbs. Height:  68"BMI: 29  Constitutional:  pleasant white male in no acute distress, moderately obese Skin:  warm and dry to touch, no apparent skin lesions, or masses noted. Head:  normocephalic, normal hair pattern, no masses or tenderness Neck:  supple, without massess. No JVD, thyromegaly or carotid bruits. Carotid upstroke normal. Chest:  normal symmetry, rhonchi, expiratory wheezes Cardiac:  regular rhythm, normal S1 and S2, No S3 or S4, no murmurs, gallops or rubs detected. Peripheral Pulses:  the femoral,dorsalis pedis, and posterior tibial pulses are full and equal bilaterally with no bruits auscultated. Extremities & Back:  bilateral venous insufficiency changes present, no edema present Neurological:  no gross motor or sensory deficits noted, affect appropriate, oriented x3. ____________________________ IMPRESSIONS/PLAN  1. Recent cardiomyopathy with improvement in systolic function by recent echo 2. Hypertensive heart disease control 3. Obesity with weight loss since here 4. Bilateral venous insufficiency  Recommendations:  Continue current medication as systolic function has improved. Followup in 6 months and continue activity. Followup abnormal chest x-ray with pulmonology. ____________________________ TODAYS ORDERS  1. Return Visit: 6 months                       ____________________________ Cardiology Physician:  Darden Palmer MD [facility]

## 2018-04-11 NOTE — Consult Note (Signed)
Warren White    Date of visit:  04/13/2017 DOB:  June 21, 1926    Age:  84 yrs. Medical record number:  [id number]     Account number:  [id number] Primary Care Provider: [name] M ____________________________ CURRENT DIAGNOSES  1. Dyspnea  2. Hypertensive heart disease without heart failure  3. Chronic systolic heart failure  4. COPD  5. Gastro-esophageal reflux disease  6. Obesity ____________________________ ALLERGIES  No Known Drug Allergies ____________________________ MEDICATIONS  1. albuterol sulfate 2.5 mg/3 mL (0.083 %) solution for nebulization, PRN  2. albuterol sulfate HFA 90 mcg/actuation aerosol inhaler, PRN  3. Anoro Ellipta 62.5 mcg-25 mcg/actuation powder for inhalation, 1 puff daily  4. cyanocobalamin (vit B-12) 1,000 mcg capsule, MWF  5. ferrous sulfate 325 mg (65 mg iron) tablet, MWF  6. furosemide 40 mg tablet, BID  7. Glucosamine Chondroitin Maximum Strength 500 mg-400 mg capsule, 3 capsules PO MWF  8. guaifenesin ER 600 mg tablet, extended release 12 hr, BID  9. losartan 50 mg tablet, 1 p.o. daily  10. magnesium 250 mg tablet, MWF  11. metoprolol succinate ER 25 mg tablet,extended release 24 hr, 1/2 tab daily  12. Mirapex 0.25 mg tablet, 2 tablets BID  13. multivitamin tablet, 1 p.o. daily  14. naproxen sodium 220 mg tablet, PRN  15. omega 3-dha-epa-fish oil 1,000 mg (120 mg-180 mg) capsule, MWF  16. pantoprazole 40 mg tablet,delayed release, 1 p.o. daily  17. spironolactone 25 mg tablet, 1 p.o. daily  18. trazodone 100 mg tablet, 2 tablets at HS  19. Vitamin C 500 mg tablet, MWF  20. Vitamin D3 1,000 unit tablet, MWF ____________________________ HISTORY OF PRESENT ILLNESS This very nice 84 year old male is seen for cardiology evaluation of heart failure. The patient was recently admitted to the hospital in August where he was found to have combined COPD as well as systolic heart failure. He was seen in consultation by another cardiologist in town and  was started on low-dose beta blockers, spironolactone, and losartan. He also qualified for home oxygen and was found to have an element of COPD. He has not seen a pulmonologist since discharge. He diuresed about 12 pounds. He currently lives with his wife who has recovered from 2 strokes and he has been limited because of dyspnea. He has significant venous insufficiency. Complete hospital records were reviewed from that time and his EF was 35-40% with diffuse hypokinesis and moderate LVH. He denies PND or orthopnea but has dyspnea with only walking 50 yards. He did not have any chest discomfort suggestive of angina. He is not currently having edema. He recalls no prior history of a heart attack. He is fairly adamant that he doesn't really like to go to the hospital or walk to go to the hospital and also was seen by palliativecare and has expressed a wish not to be resuscitated. ____________________________ PAST HISTORY  Past Medical Illnesses:  hypertension, GERD, BPH, COPD, restless legs;  Cardiovascular Illnesses:  CHF;  Infectious Diseases:  no previous history of significant infectious diseases;  Surgical Procedures:  back surgery, hip replacement-bil, shoulder surgery, cornea transplants;  Trauma History:  no previous history of significant trauma;  NYHA Classification:  I;  Cardiology Procedures-Invasive:  no previous interventional or invasive cardiology procedures;  Cardiology Procedures-Noninvasive: ECHO August 2018;  Peripheral Vascular Procedures:  no previous invasive peripheral vascular procedures.;  LVEF not documented,   ____________________________ CARDIO-PULMONARY TEST DATES EKG Date:  04/13/2017;   ____________________________ FAMILY HISTORY Father -- Father dead Mother -- Mother  dead, Death due to natural cause ____________________________ SOCIAL HISTORY Alcohol Use:  1 glass of wine or mixed drink daily;  Smoking:  smokes a pipe;  Diet:  regular diet;  Lifestyle:  married;  Exercise:   no regular exercise, swims 3 days a week and golfs;  Occupation:  retired Camera operator;  Residence:  lives with wife;   ____________________________ REVIEW OF SYSTEMS General:  obesity, malaise and fatigue  Integumentary: no rashes or new skin lesions. Eyes: denies diplopia, history of glaucoma or visual problems. Ears, Nose, Throat, Mouth:  denies any hearing loss, epistaxis, hoarseness or difficulty speaking. Respiratory: dyspnea with exertion Cardiovascular:  please review HPI Abdominal: history of GERD and heartburn Genitourinary-Male: nocturia  Musculoskeletal:  chronic venous insufficiency Neurological:  denies headaches, stroke, or TIA  Hematological/Immunologic:  denies any food allergies, bleeding disorders. ____________________________ PHYSICAL EXAMINATION VITAL SIGNS  Blood Pressure:  126/70 Sitting, Left arm, large cuff  , 110/80 Standing, Left arm and large cuff   Pulse:  84/min. Weight:  200.00 lbs. Height:  68"BMI: 30  Constitutional:  pleasant white male in no acute distress, moderately obese Skin:  warm and dry to touch, no apparent skin lesions, or masses noted. Head:  normocephalic, normal hair pattern, no masses or tenderness Eyes:  EOMS Intact, PERRLA, C and S clear, Funduscopic exam not done. ENT:  ears, nose and throat reveal no gross abnormalities.  Dentition good. Neck:  supple, without massess. No JVD, thyromegaly or carotid bruits. Carotid upstroke normal. Chest:  normal symmetry, clear to auscultation. Cardiac:  regular rhythm, normal S1 and S2, No S3 or S4, no murmurs, gallops or rubs detected. Abdomen:  abdomen soft,non-tender, no masses, no hepatospenomegaly, or aneurysm noted, ventral hernia present Peripheral Pulses:  the femoral,dorsalis pedis, and posterior tibial pulses are full and equal bilaterally with no bruits auscultated. Extremities & Back:  marked bilateral venous insufficiency changes present Neurological:  no gross motor or sensory deficits noted,  affect appropriate, oriented x3. ___________________________ IMPRESSIONS/PLAN 1. Chronic systolic heart failure borderline compensated 2. COPD 3. Hypertensive heart disease 4. Chronic venous insufficiency  Recommendations:  Extensive hospital records reviewed. I talked to him about heart failure and try to get him to understand the issues involved. He is restricting his fluids and is weighing daily although he had a recent 6 pound weight gain and was told to increase his furosemide to 120 mg daily. I think this is reasonable. What I recommended is that he changed to [facility] 24/26 twice daily and stop the losartan. This can hopefully reduce hospitalizations in the future. He does some swimming as well as golf and I encouraged him to be as active as he can. He should continue to weigh daily and I will see him in followup in one month. Extensive hospital records reviewed.  Twelve-lead EKG shows sinus rhythm with left axis deviation and an IV conduction delay personally reviewed by me. Continue to wear home oxygen. ____________________________ TODAYS ORDERS  1. 12 Lead EKG: Today  2. Return Visit: 1 month                       ____________________________ Cardiology Physician:  Darden Palmer MD [facility]

## 2018-04-16 ENCOUNTER — Ambulatory Visit (INDEPENDENT_AMBULATORY_CARE_PROVIDER_SITE_OTHER): Payer: Medicare Other | Admitting: Family Medicine

## 2018-04-16 ENCOUNTER — Encounter: Payer: Self-pay | Admitting: Family Medicine

## 2018-04-16 VITALS — BP 142/76 | HR 69 | Temp 97.6°F | Wt 194.1 lb

## 2018-04-16 DIAGNOSIS — J449 Chronic obstructive pulmonary disease, unspecified: Secondary | ICD-10-CM

## 2018-04-16 DIAGNOSIS — I739 Peripheral vascular disease, unspecified: Secondary | ICD-10-CM | POA: Diagnosis not present

## 2018-04-16 DIAGNOSIS — Z23 Encounter for immunization: Secondary | ICD-10-CM

## 2018-04-16 NOTE — Patient Instructions (Signed)
It was very nice to see you today!  Please let me know if you change your mind about seeing the vascular specialist.  Please start a baby aspirin daily.  We will give you your pneumonia shot today.  Come back to see me in 6 months, or sooner as needed.   Take care, Dr Jimmey Ralph

## 2018-04-16 NOTE — Assessment & Plan Note (Signed)
Had a lengthy discussion regarding treatment plan at this point.  He would prefer to continue with watchful waiting-he does not want to have any interventions done at this point.  He will start baby aspirin daily.  He does not wish to start any other medications at this point.  I encouraged him to continue his activity with swimming and golf.  Discussed reasons to return to care and seek emergent care.

## 2018-04-16 NOTE — Assessment & Plan Note (Signed)
Stable.  Will give second pneumonia vaccine today.  Continue albuterol as needed.  Patient declines starting other inhalers today.

## 2018-04-16 NOTE — Progress Notes (Signed)
   Subjective:  Warren White is a 84 y.o. male who presents today with a chief complaint of peripheral artery disease.   HPI:  PAD, new problem Patient was seen about a month ago for ulceration of his right foot.  That time he underwent work-up for possible peripheral vascular disease including lower extremity Dopplers and ABI.  His results were significant for arterial disease bilaterally.  Over the last month, patient has done well.  He occasionally has burning sensation to his legs which is tolerable.  Does not have any areas of open sores or ulceration.  He is able to go swimming routinely and play golf regularly.  Symptoms seem to be stable and well controlled.  He has not noticed anything that makes his leg pain better or worse.  COPD Symptoms are stable.  Has not been using his inhaler.  Takes Mucinex daily.   ROS: Per HPI  PMH: He reports that he has been smoking pipe. He has never used smokeless tobacco. He reports that he drinks about 14.0 standard drinks of alcohol per week. He reports that he does not use drugs.  Objective:  Physical Exam: BP (!) 142/76   Pulse 69   Temp 97.6 F (36.4 C) (Oral)   Wt 194 lb 2 oz (88.1 kg)   SpO2 93%   BMI 29.52 kg/m   Gen: NAD, resting comfortably CV: RRR with no murmurs appreciated Pulm: NWOB, CTAB with no crackles, wheezes, or rhonchi Skin: Well-healed wound on right lower extremity.  Bilateral feet with obvious wounds or ulcerations. Assessment/Plan:  Peripheral artery disease (HCC) Had a lengthy discussion regarding treatment plan at this point.  He would prefer to continue with watchful waiting-he does not want to have any interventions done at this point.  He will start baby aspirin daily.  He does not wish to start any other medications at this point.  I encouraged him to continue his activity with swimming and golf.  Discussed reasons to return to care and seek emergent care.  COPD (chronic obstructive pulmonary disease)  (HCC) Stable.  Will give second pneumonia vaccine today.  Continue albuterol as needed.  Patient declines starting other inhalers today.  Katina Degree. Jimmey Ralph, MD 04/16/2018 12:03 PM

## 2018-05-04 ENCOUNTER — Other Ambulatory Visit: Payer: Self-pay | Admitting: Family Medicine

## 2018-05-30 DIAGNOSIS — Z947 Corneal transplant status: Secondary | ICD-10-CM | POA: Diagnosis not present

## 2018-05-30 DIAGNOSIS — H1851 Endothelial corneal dystrophy: Secondary | ICD-10-CM | POA: Diagnosis not present

## 2018-05-30 DIAGNOSIS — Z961 Presence of intraocular lens: Secondary | ICD-10-CM | POA: Diagnosis not present

## 2018-06-15 ENCOUNTER — Other Ambulatory Visit: Payer: Self-pay | Admitting: Cardiology

## 2018-06-15 MED ORDER — SACUBITRIL-VALSARTAN 24-26 MG PO TABS: 1.0000 | ORAL_TABLET | Freq: Two times a day (BID) | ORAL | 0 refills | Status: DC

## 2018-06-15 NOTE — Telephone Encounter (Signed)
Entresto 24-26 twice daily refilled per Dr. Bing MatterKrasowski.

## 2018-06-15 NOTE — Telephone Encounter (Signed)
° ° ° °  1. Which medications need to be refilled? (please list name of each medication and dose if known) Entresto 24mg -26mg  tablet 1BID  2. Which pharmacy/location (including street and city if local pharmacy) is medication to be sent to?[facility]  3. Do they need a 30 day or 90 day supply? 90

## 2018-06-18 ENCOUNTER — Other Ambulatory Visit: Payer: Self-pay | Admitting: Family Medicine

## 2018-07-06 DIAGNOSIS — H26491 Other secondary cataract, right eye: Secondary | ICD-10-CM | POA: Diagnosis not present

## 2018-07-26 IMAGING — DX DG CHEST 1V
1 series · 1 of 1 positions shown · non-contrast
Comparison: 05/15/2017 and prior radiograph

CLINICAL DATA: Cough, shortness of breath and fever for 1-2 weeks.

EXAM:
CHEST 1 VIEW

[chest pa]
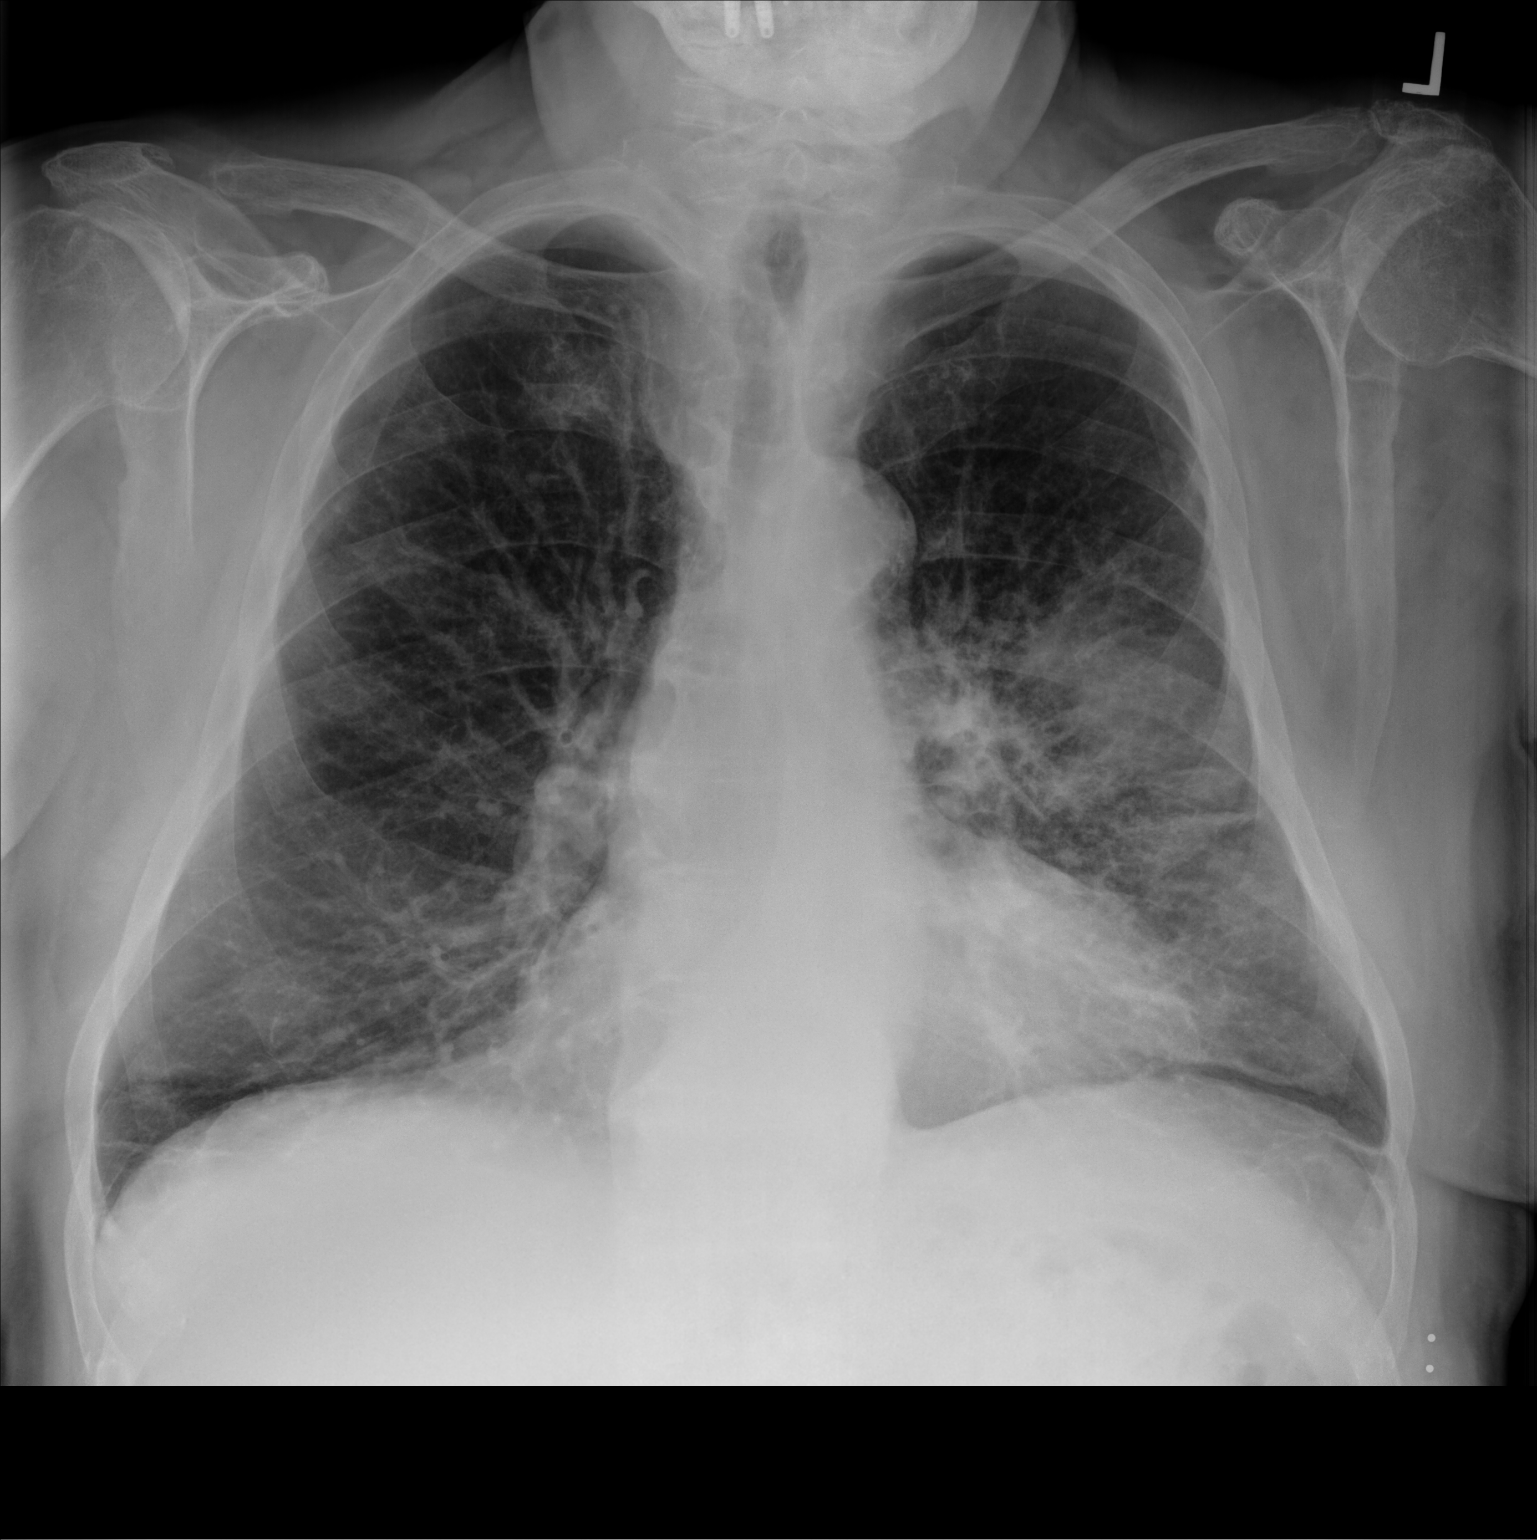

[1 of 1 positions shown; findings below may reference images not displayed]

FINDINGS: New airspace disease/ consolidation within the mid and lower left
lung noted, compatible with pneumonia.

Upper limits normal heart size again noted.

There is no evidence of pleural effusion or pneumothorax.
IMPRESSION: Mid-lower left lung airspace disease compatible with pneumonia.

## 2018-08-01 ENCOUNTER — Other Ambulatory Visit: Payer: Self-pay | Admitting: Family Medicine

## 2018-09-24 ENCOUNTER — Other Ambulatory Visit: Payer: Self-pay | Admitting: Family Medicine

## 2018-10-08 ENCOUNTER — Other Ambulatory Visit: Payer: Self-pay | Admitting: Pulmonary Disease

## 2018-10-30 ENCOUNTER — Other Ambulatory Visit: Payer: Self-pay | Admitting: Family Medicine

## 2018-11-01 ENCOUNTER — Ambulatory Visit: Payer: Medicare Other | Admitting: Family Medicine

## 2018-11-02 ENCOUNTER — Ambulatory Visit (INDEPENDENT_AMBULATORY_CARE_PROVIDER_SITE_OTHER): Payer: Medicare Other | Admitting: Family Medicine

## 2018-11-02 ENCOUNTER — Encounter: Payer: Self-pay | Admitting: Family Medicine

## 2018-11-02 VITALS — HR 86

## 2018-11-02 DIAGNOSIS — R05 Cough: Secondary | ICD-10-CM

## 2018-11-02 DIAGNOSIS — J449 Chronic obstructive pulmonary disease, unspecified: Secondary | ICD-10-CM | POA: Diagnosis not present

## 2018-11-02 DIAGNOSIS — B079 Viral wart, unspecified: Secondary | ICD-10-CM

## 2018-11-02 DIAGNOSIS — R059 Cough, unspecified: Secondary | ICD-10-CM

## 2018-11-02 MED ORDER — PREDNISONE 50 MG PO TABS: ORAL_TABLET | ORAL | 0 refills | Status: DC

## 2018-11-02 MED ORDER — DOXYCYCLINE HYCLATE 100 MG PO TABS: 100.0000 mg | ORAL_TABLET | Freq: Two times a day (BID) | ORAL | 0 refills | Status: DC

## 2018-11-02 NOTE — Progress Notes (Signed)
   Chief Complaint:  Warren White is a 85 y.o. male who presents for same day appointment with a chief complaint of cough.   Assessment/Plan:  Cough Consistent with COPD exacerbation.  No signs of COVID-19 infection or pneumonia.  Will start course of prednisone and doxycycline.  Encouraged patient to use albuterol every 4-6 hours over the next few days.  Discussed reasons to return to care or seek emergent care.  Follow-up as needed.  Cutaneous wart Cryotherapy applied today.  See below procedure note.  Patient tolerated well.     Subjective:  HPI:  Cough, acute problem Has been present for several months but has worsened over the last few weeks. Associated with increased wheeze, sputum production, and lethargy. No fevers or chills. Has tried using home albuterol with modest improvement. Symptoms are worse with exertion. No worsening LE edema. No other obvious alleviating or aggravating factors.   Skin Lesion Located on left middle finger. Has been present for several years. Stable in size. Area is sometimes painful and gets irritated.   ROS: Per HPI  PMH: He reports that he has been smoking pipe. He has never used smokeless tobacco. He reports current alcohol use of about 14.0 standard drinks of alcohol per week. He reports that he does not use drugs.      Objective:  Physical Exam: Pulse 86   SpO2 93%   Gen: NAD, resting comfortably CV: Regular rate and rhythm with no murmurs appreciated Pulm: Minimally increased work of breathing. Diffuse wheeze and rhonchi throughout all lung fields.  Good airflow throughout. MSK: Trace pretibial edema bilaterally with chronic venous stasis changes noted. Skin: Hyperkeratotic lesion on dorsal aspect of DIP joint of left third digit.  Cryotherapy Procedure Note  Pre-operative Diagnosis: Cutaneous Wart  Locations: Left Third Finger  Indications: Therapeutic  Procedure Details  Patient informed of risks (permanent scarring, infection,  light or dark discoloration, bleeding, infection, weakness, numbness and recurrence of the lesion) and benefits of the procedure and verbal informed consent obtained.  The areas are treated with liquid nitrogen therapy, frozen until ice ball extended 3 mm beyond lesion, allowed to thaw, and treated again. The patient tolerated procedure well.  The patient was instructed on post-op care, warned that there may be blister formation, redness and pain. Recommend OTC analgesia as needed for pain.  Condition: Stable  Complications: none.        Katina Degree. Jimmey Ralph, MD 11/02/2018 9:16 AM

## 2018-11-15 ENCOUNTER — Ambulatory Visit: Payer: Self-pay | Admitting: *Deleted

## 2018-11-15 NOTE — Telephone Encounter (Signed)
Please advise 

## 2018-11-15 NOTE — Telephone Encounter (Signed)
Recommend ED.  If he refuses, we can try another round of oral medications but we are near the limit of what we can do as an outpatient and if he does not improve he will have to go to the hospital.  Katina Degree. Jimmey Ralph, MD 11/15/2018 12:40 PM

## 2018-11-15 NOTE — Telephone Encounter (Signed)
Spoke with daughter and they would like medications called in.  They verbalized understanding that this is all that can be done and after this patient must go to ED if not better.  Would like meds called in to [facility].

## 2018-11-15 NOTE — Telephone Encounter (Signed)
Pt's daughter Sterling Big calling initially, on [place], pt not not present. TN called and spoke with pt.  Reports increased SOB past 2-3 days, with intermittent wheezing, productive cough for small amounts clear phlegm. Saw Dr. Jimmey Ralph 11/02/2018, placed on steroids and antibiotic. States "Felt better until few days ago."  Reports SOB with minimal exertion, denies at rest. States "Worse in mornings, gets better throughout day." On home O2, unsure of Ls. Does not have working pulse OX. States uses nebulizer 3 xs a day, has not used yet this AM. Denies CP, chest tightness, no dizziness. Pt s daughter stating pt will not go to hospital, requests additional meds or "Maybe a shot."  Pts speech non halting during call, occasional audible wheeze noted. TN called practice, Diannia Ruder, advised to route high priority. Care advise given to patient per protocol. Verbalizes understanding. CB#  [phone] Pt also requests CB to daughter CB [phone]  Reason for Disposition . [1] MILD difficulty breathing (e.g., minimal/no SOB at rest, SOB with walking, pulse <100) AND [2] NEW-onset or WORSE than normal  Answer Assessment - Initial Assessment Questions 1. RESPIRATORY STATUS: "Describe your breathing?" (e.g., wheezing, shortness of breath, unable to speak, severe coughing)      Wheezing 2. ONSET: "When did this breathing problem begin?"       Worsened 3 days ago 3. PATTERN "Does the difficult breathing come and go, or has it been constant since it started?"     Worse in AM, gets better during day 4. SEVERITY: "How bad is your breathing?" (e.g., mild, moderate, severe)    - MILD: No SOB at rest, mild SOB with walking, speaks normally in sentences, can lay down, no retractions, pulse < 100.    - MODERATE: SOB at rest, SOB with minimal exertion and prefers to sit, cannot lie down flat, speaks in phrases, mild retractions, audible wheezing, pulse 100-120.    - SEVERE: Very SOB at rest, speaks in single words, struggling to breathe,  sitting hunched forward, retractions, pulse > 120      States with minimal exertion 5. RECURRENT SYMPTOM: "Have you had difficulty breathing before?" If so, ask: "When was the last time?" and "What happened that time?"      H/O COPD 6. CARDIAC HISTORY: "Do you have any history of heart disease?" (e.g., heart attack, angina, bypass surgery, angioplasty)       7. LUNG HISTORY: "Do you have any history of lung disease?"  (e.g., pulmonary embolus, asthma, emphysema)    St 4 COPD 8. CAUSE: "What do you think is causing the breathing problem?"      Always SOB 9. OTHER SYMPTOMS: "Do you have any other symptoms? (e.g., dizziness, runny nose, cough, chest pain, fever)    none 11. TRAVEL: "Have you traveled out of the country in the last month?" (e.g., travel history, exposures)       no  Protocols used: BREATHING DIFFICULTY-A-AH

## 2018-11-15 NOTE — Telephone Encounter (Signed)
See note, Amber is aware and stated she needed to speak with Dr. Jimmey Ralph.

## 2018-11-16 ENCOUNTER — Telehealth: Payer: Self-pay | Admitting: Family Medicine

## 2018-11-16 MED ORDER — DOXYCYCLINE HYCLATE 100 MG PO TABS: 100.0000 mg | ORAL_TABLET | Freq: Two times a day (BID) | ORAL | 0 refills | Status: DC

## 2018-11-16 MED ORDER — PREDNISONE 50 MG PO TABS: ORAL_TABLET | ORAL | 0 refills | Status: DC

## 2018-11-16 NOTE — Telephone Encounter (Signed)
Copied from CRM [id number]. Topic: General - Other >> Nov 16, 2018  9:21 AM Tamela Oddi wrote: Reason for CRM: Patient's wife called to check on the status of his medication that she said the doctor is supposed to call into the pharmacy.  Please advise and call back at [phone]

## 2018-11-16 NOTE — Telephone Encounter (Signed)
Please advise 

## 2018-11-16 NOTE — Telephone Encounter (Signed)
Notified. 

## 2018-12-03 IMAGING — DX DG CHEST 2V
2 series · 2 of 2 positions shown · non-contrast
Comparison: Chest x-ray of July 26, 2017

CLINICAL DATA: Follow-up of pneumonia. No current complaints.
History of COPD, CHF, current smoker.

EXAM:
CHEST - 2 VIEW

[chest pa]
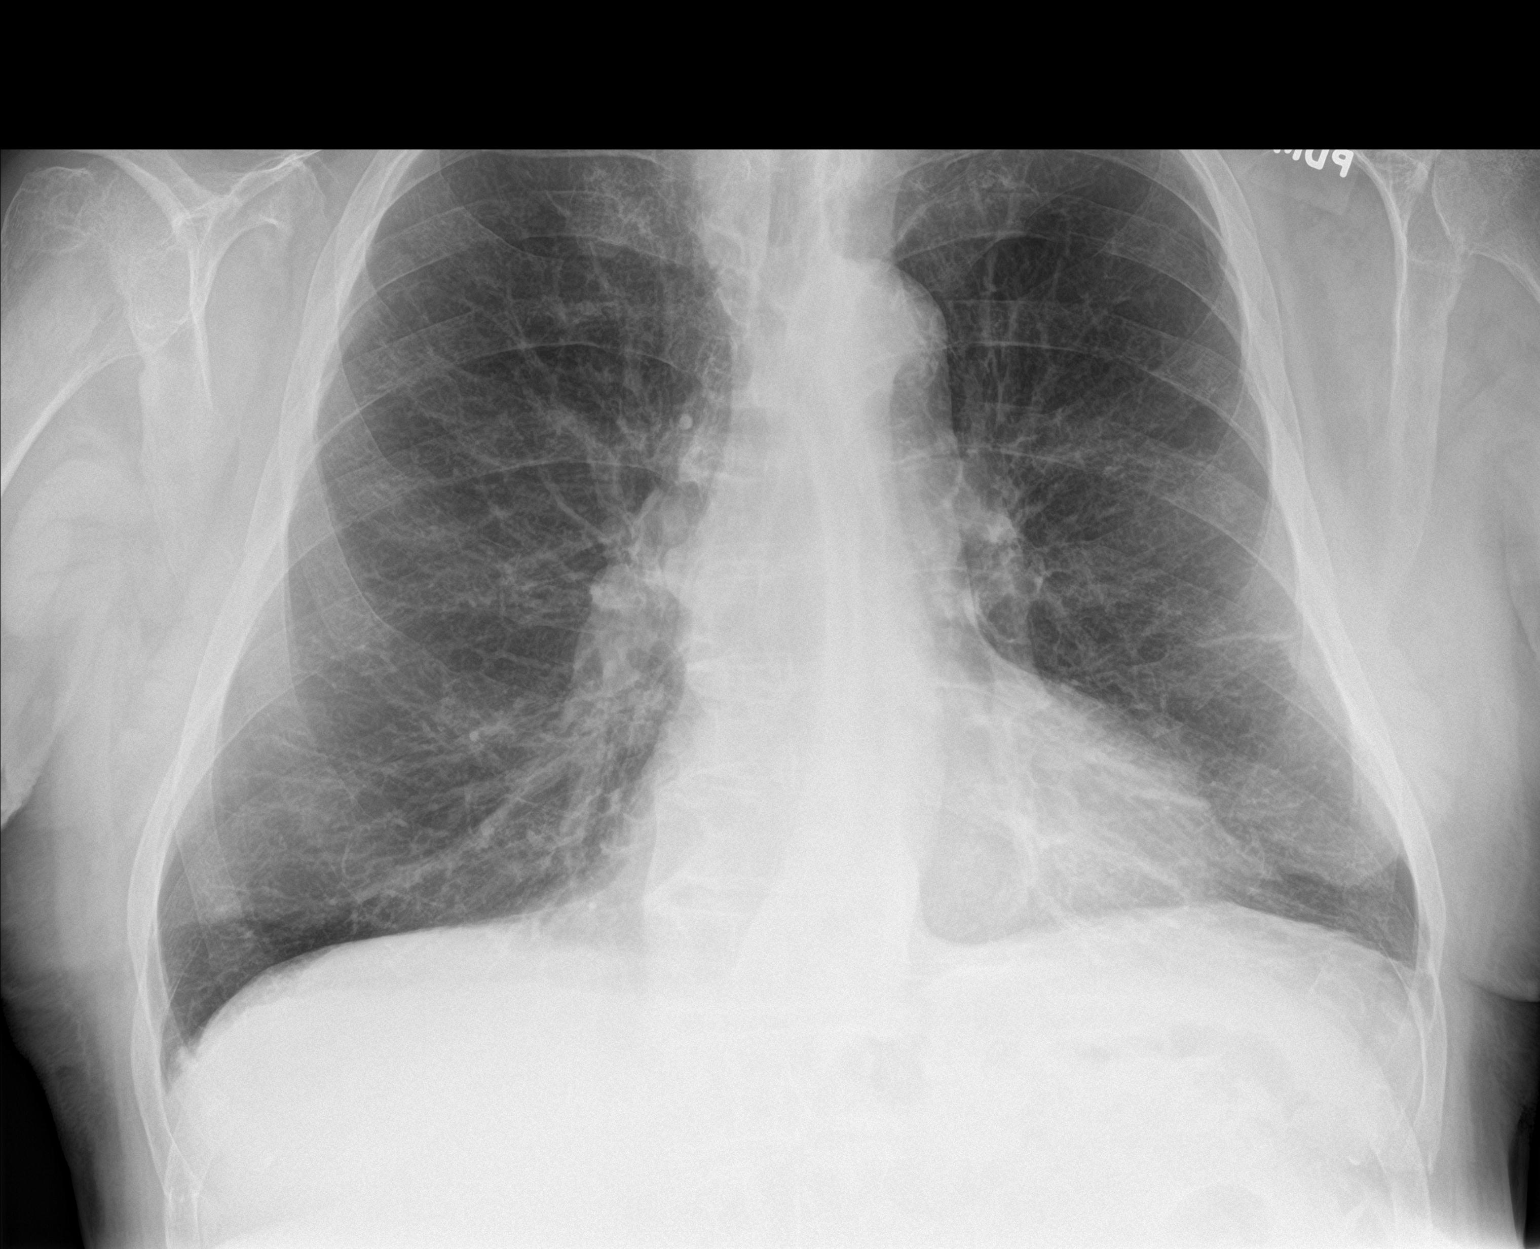

[chest lat]
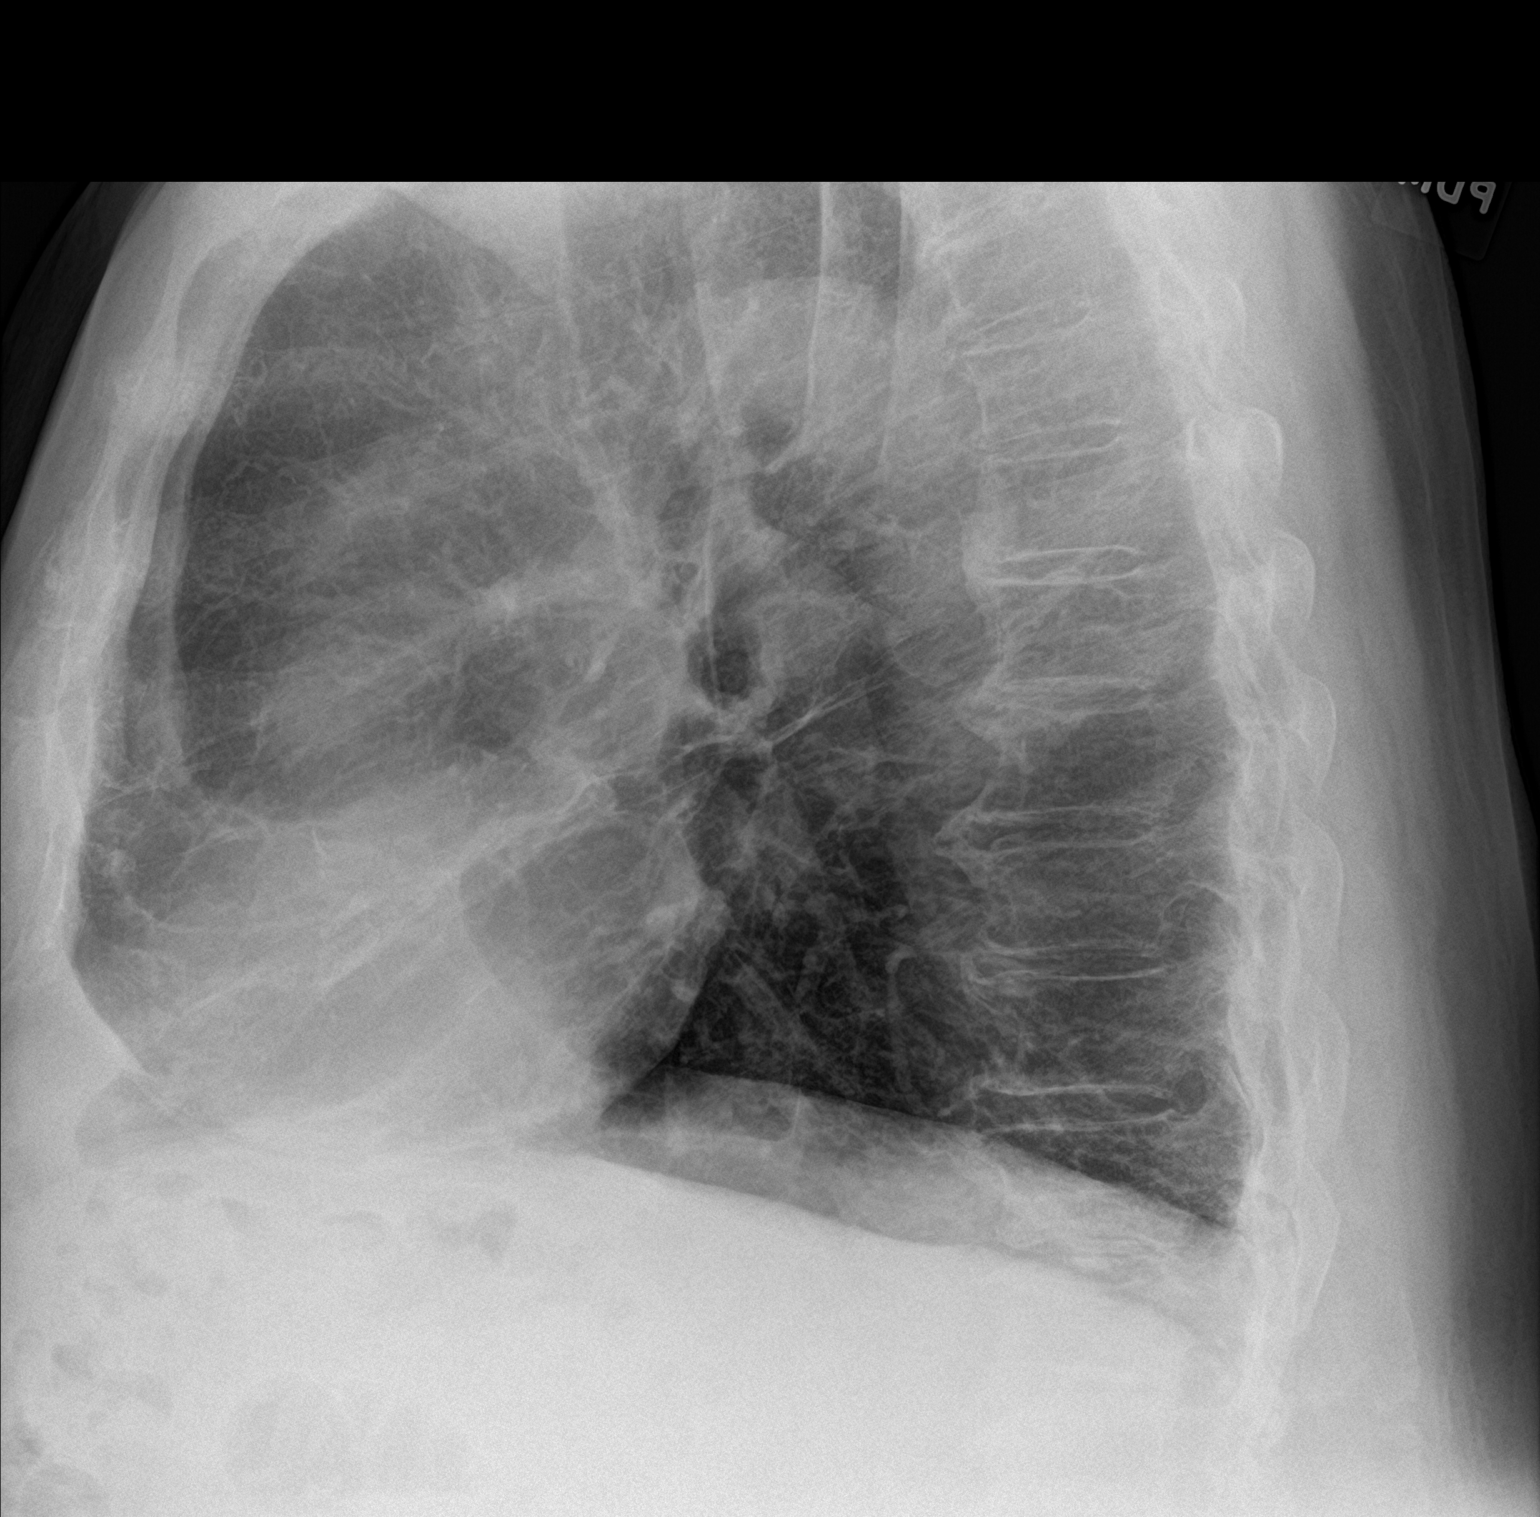

[2 of 2 positions shown; findings below may reference images not displayed]

FINDINGS: Infiltrate in the left mid lung has cleared. Both lungs are
well-expanded. There is mild hemidiaphragm flattening. The
interstitial markings are coarse though stable. The heart and
pulmonary vascularity are normal. The mediastinum is normal in
width. The trachea is midline. The bony thorax exhibits no acute
abnormality.
IMPRESSION: COPD.  No residual pneumonia.

## 2018-12-17 ENCOUNTER — Telehealth: Payer: Self-pay | Admitting: Pulmonary Disease

## 2018-12-17 NOTE — Telephone Encounter (Signed)
Primary Pulmonologist: Mannam Last office visit and with whom: 10/23/2017 w/ Dr Vaughan Browner What do we see them for (pulmonary problems): COPD  Reason for call: called spoke with patient's daughter Landry Dyke who reports patient has been progressively [facility] over the last 2 years but feels that he may be nearing end stage COPD/end of life.  Patient is unable to walk 10 steps without sitting/waiting/resting (not a new issue).  She doesn't really think there is anything we can do but she states that patient has questions ie "maybe it's because I have a big tummy because it only happens when I'm in a certain position" and "maybe they'll tell me I need to lose weight.  And if they say I need to lose weight then I'll stop eating for 2 weeks" and patient says it takes 2 hours to go 10 steps from the couch to the paper in the mornings. Patient became 'desperate enough' to ask for help in early may when pt saw PCP and got abx and prednisone, then again on 5.22.2020 another round of abx and pred but neither round resolved his symptoms. Current pipe smoker, increased smoking since COVID.  Advised daughter Landry Dyke that patient's [facility] and questions would be better addressed if we could see him face-to-face since it has been over 1 year since his last appt.  [name] okay with this and voiced her understanding but cannot bring patient until Wednesday.  Appt scheduled with Tonya NP for 6.24.2020 @ 0930.  Would likely be best to have [name] accompany pt d/t pt's age and decline in status.  Will sing and route to [facility] NP to make her aware.  In the last month, have you been in contact with someone who was confirmed or suspected to have Conoravirus / COVID-19?  No for both pt and daughter Landry Dyke  Do you have any of the following symptoms developed in the last 30 days? Fever: no for both pt and daughter [name] Cough: no for both pt and daughter Chad Shortness of breath: no new SHOB for both pt and daughter Landry Dyke  When did your symptoms start?   Progressively worsening x2 years  If the patient has a fever, what is the last reading?  (use n/a if patient denies fever)  n/a . IF THE PATIENT STATES THEY DO NOT OWN A THERMOMETER, THEY MUST GO AND PURCHASE ONE When did the fever start?: n/a Have you taken any medication to suppress a fever (ie Ibuprofen, Aleve, Tylenol)?: n/a

## 2018-12-19 ENCOUNTER — Ambulatory Visit (INDEPENDENT_AMBULATORY_CARE_PROVIDER_SITE_OTHER): Payer: Medicare Other

## 2018-12-19 ENCOUNTER — Other Ambulatory Visit: Payer: Self-pay

## 2018-12-19 ENCOUNTER — Ambulatory Visit (INDEPENDENT_AMBULATORY_CARE_PROVIDER_SITE_OTHER): Payer: Medicare Other | Admitting: Nurse Practitioner

## 2018-12-19 ENCOUNTER — Encounter: Payer: Self-pay | Admitting: Nurse Practitioner

## 2018-12-19 VITALS — BP 130/68 | HR 84 | Temp 97.6°F | Ht 68.0 in | Wt 201.8 lb

## 2018-12-19 DIAGNOSIS — J449 Chronic obstructive pulmonary disease, unspecified: Secondary | ICD-10-CM

## 2018-12-19 DIAGNOSIS — R062 Wheezing: Secondary | ICD-10-CM | POA: Diagnosis not present

## 2018-12-19 DIAGNOSIS — J441 Chronic obstructive pulmonary disease with (acute) exacerbation: Secondary | ICD-10-CM | POA: Diagnosis not present

## 2018-12-19 DIAGNOSIS — R0602 Shortness of breath: Secondary | ICD-10-CM | POA: Diagnosis not present

## 2018-12-19 LAB — COMPREHENSIVE METABOLIC PANEL
ALT: 16 U/L (ref 0–53)
AST: 16 U/L (ref 0–37)
Albumin: 4 g/dL (ref 3.5–5.2)
Alkaline Phosphatase: 74 U/L (ref 39–117)
BUN: 25 mg/dL — ABNORMAL HIGH (ref 6–23)
CO2: 32 mEq/L (ref 19–32)
Calcium: 8.9 mg/dL (ref 8.4–10.5)
Chloride: 103 mEq/L (ref 96–112)
Creatinine, Ser: 1.03 mg/dL (ref 0.40–1.50)
GFR: 67.67 mL/min (ref >60.00)
Glucose, Bld: 123 mg/dL — ABNORMAL HIGH (ref 70–99)
Potassium: 4.7 mEq/L (ref 3.5–5.1)
Sodium: 142 mEq/L (ref 135–145)
Total Bilirubin: 0.4 mg/dL (ref 0.2–1.2)
Total Protein: 7.3 g/dL (ref 6.0–8.3)

## 2018-12-19 LAB — CBC WITH DIFFERENTIAL/PLATELET
Basophils Absolute: 0.1 10*3/uL (ref 0.0–0.1)
Basophils Relative: 1.1 % (ref 0.0–3.0)
Eosinophils Absolute: 0.5 10*3/uL (ref 0.0–0.7)
Eosinophils Relative: 7.7 % — ABNORMAL HIGH (ref 0.0–5.0)
HCT: 51.7 % (ref 39.0–52.0)
Hemoglobin: 16.9 g/dL (ref 13.0–17.0)
Lymphocytes Relative: 20.2 % (ref 12.0–46.0)
Lymphs Abs: 1.2 10*3/uL (ref 0.7–4.0)
MCHC: 32.7 g/dL (ref 30.0–36.0)
MCV: 91 fl (ref 78.0–100.0)
Monocytes Absolute: 0.6 10*3/uL (ref 0.1–1.0)
Monocytes Relative: 10.6 % (ref 3.0–12.0)
Neutro Abs: 3.6 10*3/uL (ref 1.4–7.7)
Neutrophils Relative %: 60.4 % (ref 43.0–77.0)
Platelets: 178 10*3/uL (ref 150.0–400.0)
RBC: 5.68 Mil/uL (ref 4.22–5.81)
RDW: 16.2 % — ABNORMAL HIGH (ref 11.5–15.5)
WBC: 6 10*3/uL (ref 4.0–10.5)

## 2018-12-19 LAB — D-DIMER, QUANTITATIVE: D-Dimer, Quant: 0.58 mcg/mL FEU — ABNORMAL HIGH (ref <0.50)

## 2018-12-19 NOTE — Progress Notes (Signed)
 @Patient  ID: Warren White, male    DOB: 23-May-1926, 85 y.o.   MRN: [id number]  Chief Complaint  Patient presents with  . COPD    Cannot walk short distances. Has been wheezing and some back pain.    Referring provider: Ardith DarkParker, Caleb M, White  HPI   85 year old male active smoker with COPD, emphysema, GERD is followed by Dr. Isaiah White.  Tests: Chest x-ray 02/02/17- cardiomegaly, hyperinflated lungs with mild bronchitis changes. Chest x-ray 04/10/17-lower lobe segmental atelectasis Chest x-ray 05/15/17-improvement in left base opacity Chest x-ray 06/15/17- new left midlung and lower lobe consolidation Chest x-ray1/30/2019-improving left lung consolidation Chest x-ray 10/23/2017- No residual pneumonia I reviewed the images personally  Echocardiogram 02/03/17 > LVEF 35-40 percent, diffuse hypokinesis, grade 1 diastolic dysfunction  Spirometry 07/26/17 FVC 2.04 [2%], FEV1 1.05 [47%], F/F 51 Severe airway obstruction  OV 12/20/18 - Follow up COPD/shortness of breath Patient presents for follow-up on COPD.  He was last seen by Dr. Isaiah White on 10/23/2017.  Patient complains today of increased shortness of breath with exertion.  Patient had been active swimming daily and playing golf until his gym was closed due to current pandemic.  He states that since this time he has been more inactive.  He does continue to smoke.  He does use duo nebs as directed.  At his last visit with Dr. Isaiah White he was not interested in trying inhalers at that time and still is not interested.  Patient denies any significant cough.  Denies f/c/s, n/v/d, hemoptysis, PND, leg swelling.  Note: Patient walked in office today and sats remained above 90% the entire walk   No Known Allergies  Immunization History  Administered Date(s) Administered  . Influenza, High Dose Seasonal PF 04/10/2017  . Pneumococcal Conjugate-13 04/10/2017  . Pneumococcal Polysaccharide-23 04/16/2018    Past Medical History:  Diagnosis Date  .  COPD (chronic obstructive pulmonary disease) (HCC)   . Emphysema of lung (HCC)   . GERD (gastroesophageal reflux disease)   . Psoriasis   . Restless leg     Tobacco History: Social History   Tobacco Use  Smoking Status Current Every Day Smoker  . Types: Pipe  Smokeless Tobacco Never Used   Ready to quit: No Counseling given: Yes   Outpatient Encounter Medications as of 12/19/2018  Medication Sig  . albuterol (PROVENTIL HFA;VENTOLIN HFA) 108 (90 Base) MCG/ACT inhaler Inhale 2 puffs into the lungs every 6 (six) hours as needed for wheezing or shortness of breath.  . cholecalciferol (VITAMIN D) 1000 units tablet Take 1,000 Units by mouth every Monday, Wednesday, and Friday.  . clobetasol cream (TEMOVATE) 0.05 % Apply 1 application topically 2 (two) times daily.  . Cyanocobalamin (VITAMIN B-12 PO) Take 1 tablet by mouth every Monday, Wednesday, and Friday.  Marland Kitchen. dextromethorphan-guaiFENesin (MUCINEX DM) 30-600 MG 12hr tablet Take 1 tablet by mouth 2 (two) times daily.  . Ferrous Sulfate (IRON) 325 (65 Fe) MG TABS Take 1 tablet by mouth every Monday, Wednesday, and Friday.  . furosemide (LASIX) 40 MG tablet Take 1 tablet (40 mg total) by mouth 2 (two) times daily. (Patient taking differently: Take 40 mg by mouth daily. )  . glucosamine-chondroitin 500-400 MG tablet Take 3 tablets by mouth every Monday, Wednesday, and Friday.  Marland Kitchen. ipratropium-albuterol (DUONEB) 0.5-2.5 (3) MG/3ML SOLN USE 1 VIAL IN NEBULIZER 3 TIMES DAILY. Generic: DUONEB  . MAGNESIUM PO Take 1 tablet by mouth every Monday, Wednesday, and Friday.  . metoprolol succinate (TOPROL-XL) 25 MG 24 hr tablet  Take 0.5 tablets (12.5 mg total) by mouth daily.  . Multiple Vitamin (MULTIVITAMIN WITH MINERALS) TABS tablet Take 1 tablet by mouth every Monday, Wednesday, and Friday.  . mupirocin ointment (BACTROBAN) 2 % Apply to wound three times a day  . naproxen sodium (ANAPROX) 220 MG tablet Take 440 mg by mouth 2 (two) times a week.   .  Omega-3 Fatty Acids (FISH OIL) 1000 MG CAPS Take 1 capsule by mouth every Monday, Wednesday, and Friday.  . pantoprazole (PROTONIX) 40 MG tablet TAKE 1 TABLET ONCE DAILY IN THE EVENING.  . pramipexole (MIRAPEX) 0.5 MG tablet TAKE 1/2 TABLET TWICE DAILY. MAY TAKE AN ADDITIONAL 1/2 TABLET IF NEEDED.  Marland Kitchen predniSONE (DELTASONE) 50 MG tablet Take 1 tablet daily for 5 days. Then take 1/2 tablet daily for 2 days.  . sacubitril-valsartan (ENTRESTO) 24-26 MG Take 1 tablet by mouth 2 (two) times daily.  . traZODone (DESYREL) 100 MG tablet TAKE 2 TABLETS AT BEDTIME.  . vitamin C (ASCORBIC ACID) 500 MG tablet Take 500 mg by mouth every Monday, Wednesday, and Friday.  Marland Kitchen doxycycline (VIBRA-TABS) 100 MG tablet Take 1 tablet (100 mg total) by mouth 2 (two) times daily. (Patient not taking: Reported on 12/19/2018)   No facility-administered encounter medications on file as of 12/19/2018.      Review of Systems  Review of Systems  Constitutional: Negative.  Negative for fever.  HENT: Negative.   Respiratory: Positive for shortness of breath. Negative for cough and wheezing.   Cardiovascular: Negative.  Negative for chest pain, palpitations and leg swelling.  Gastrointestinal: Negative.   Allergic/Immunologic: Negative.   Neurological: Negative.   Psychiatric/Behavioral: Negative.        Physical Exam  BP 130/68 (BP Location: Right Arm, Patient Position: Sitting, Cuff Size: Normal)   Pulse 84   Temp 97.6 F (36.4 C)   Ht 5\' 8"  (1.727 m)   Wt 201 lb 12.8 oz (91.5 kg)   SpO2 97%   BMI 30.68 kg/m   Wt Readings from Last 5 Encounters:  12/19/18 201 lb 12.8 oz (91.5 kg)  04/16/18 194 lb 2 oz (88.1 kg)  03/12/18 193 lb 3.2 oz (87.6 kg)  10/23/17 188 lb 8 oz (85.5 kg)  07/26/17 196 lb (88.9 kg)     Physical Exam Vitals signs and nursing note reviewed.  Constitutional:      General: He is not in acute distress.    Appearance: He is well-developed.  Cardiovascular:     Rate and Rhythm: Normal  rate and regular rhythm.  Pulmonary:     Effort: Pulmonary effort is normal. No respiratory distress.     Breath sounds: Normal breath sounds. No wheezing or rhonchi.  Musculoskeletal:        General: No swelling.  Skin:    General: Skin is warm and dry.  Neurological:     Mental Status: He is alert and oriented to person, place, and time.      Imaging: Dg Chest 2 View  Result Date: 12/19/2018 CLINICAL DATA:  Chronic obstructive pulmonary disease exacerbation. EXAM: CHEST - 2 VIEW COMPARISON:  Radiographs of October 23, 2017. FINDINGS: The heart size and mediastinal contours are within normal limits. No pneumothorax or pleural effusion is noted. Stable left basilar scarring is noted. No acute pulmonary disease is noted. The visualized skeletal structures are unremarkable. IMPRESSION: No active cardiopulmonary disease. Electronically Signed   By: Marijo Conception M.D.   On: 12/19/2018 10:34     Assessment &  Plan:   COPD (chronic obstructive pulmonary disease) (HCC) Patient complains today of increased shortness of breath with exertion.  Patient had been active swimming daily and playing golf until his gym was closed due to current pandemic.  He states that since this time he has been more inactive.  He does continue to smoke.  He does use duo nebs as directed.  At his last visit with Dr. Isaiah White he was not interested in trying inhalers at that time and still is not interested.  Patient denies any significant cough.    Patient Instructions  Will check chest x ray Will check labs - CBC, CMP, D-DIMER Will call with results Continue using the duo nebs Use Mucinex and flutter valve for clearance of secretions   Follow up: Follow up with Dr. Isaiah White in 1-2 months or sooner if needed       Ivonne Andrewonya S Enza Shone, NP 12/20/2018

## 2018-12-19 NOTE — Patient Instructions (Addendum)
Will check chest x ray Will check labs - CBC, CMP, D-DIMER Will call with results Continue using the duo nebs Use Mucinex and flutter valve for clearance of secretions   Follow up: Follow up with Dr. Vaughan Browner in 1-2 months or sooner if needed

## 2018-12-20 ENCOUNTER — Encounter: Payer: Self-pay | Admitting: Nurse Practitioner

## 2018-12-20 NOTE — Assessment & Plan Note (Signed)
Patient complains today of increased shortness of breath with exertion.  Patient had been active swimming daily and playing golf until his gym was closed due to current pandemic.  He states that since this time he has been more inactive.  He does continue to smoke.  He does use duo nebs as directed.  At his last visit with Dr. Vaughan Browner he was not interested in trying inhalers at that time and still is not interested.  Patient denies any significant cough.    Patient Instructions  Will check chest x ray Will check labs - CBC, CMP, D-DIMER Will call with results Continue using the duo nebs Use Mucinex and flutter valve for clearance of secretions   Follow up: Follow up with Dr. Vaughan Browner in 1-2 months or sooner if needed

## 2018-12-24 ENCOUNTER — Other Ambulatory Visit: Payer: Self-pay | Admitting: Family Medicine

## 2018-12-24 NOTE — Telephone Encounter (Signed)
Rx Request 

## 2019-01-01 ENCOUNTER — Telehealth: Payer: Self-pay | Admitting: Cardiology

## 2019-01-01 NOTE — Telephone Encounter (Signed)
New Message:   I called pt to confirm appt. Wife said that daughter will have to come in the office with pt, he needs assistant coming in the building.

## 2019-01-01 NOTE — Telephone Encounter (Signed)
S/w Mrs Fults informed her that daughter is not able to come into appt with pt, we will call her when the doctor comes in for appointment on cell phone speaker. Pt will need help doing this on cell phone so daughter can participate in visit.

## 2019-01-02 ENCOUNTER — Ambulatory Visit (INDEPENDENT_AMBULATORY_CARE_PROVIDER_SITE_OTHER): Payer: Medicare Other | Admitting: Cardiology

## 2019-01-02 ENCOUNTER — Other Ambulatory Visit: Payer: Self-pay

## 2019-01-02 VITALS — BP 100/58 | HR 91 | Temp 97.3°F | Ht 68.0 in | Wt 200.2 lb

## 2019-01-02 DIAGNOSIS — I872 Venous insufficiency (chronic) (peripheral): Secondary | ICD-10-CM

## 2019-01-02 DIAGNOSIS — G2581 Restless legs syndrome: Secondary | ICD-10-CM

## 2019-01-02 DIAGNOSIS — Z515 Encounter for palliative care: Secondary | ICD-10-CM | POA: Diagnosis not present

## 2019-01-02 DIAGNOSIS — I739 Peripheral vascular disease, unspecified: Secondary | ICD-10-CM | POA: Diagnosis not present

## 2019-01-02 DIAGNOSIS — I5032 Chronic diastolic (congestive) heart failure: Secondary | ICD-10-CM | POA: Diagnosis not present

## 2019-01-02 DIAGNOSIS — I119 Hypertensive heart disease without heart failure: Secondary | ICD-10-CM | POA: Diagnosis not present

## 2019-01-02 MED ORDER — METOPROLOL SUCCINATE ER 25 MG PO TB24: 25.0000 mg | ORAL_TABLET | Freq: Every day | ORAL | 3 refills | Status: AC

## 2019-01-02 MED ORDER — FUROSEMIDE 40 MG PO TABS: ORAL_TABLET | ORAL | 3 refills | Status: AC

## 2019-01-02 MED ORDER — FUROSEMIDE 40 MG PO TABS: ORAL_TABLET | ORAL | 3 refills | Status: DC

## 2019-01-02 NOTE — Patient Instructions (Addendum)
Medication Instructions:  Stop taking  Entresto Continue  Taking Metoprolol  Start taking Lasix ( furosemide)  Mondays - Wednesdays -Fridays if  you have any weight gain greater than 3 lbs ,swelling or shortness of breathe especially laying down  Take your lasix ( furosemide) fluid pill.  If you need a refill on your cardiac medications before your next appointment, please call your pharmacy.   Lab work:  Not needed  Testing/Procedures: Not needed  Follow-Up: At [facility], you and your health needs are our priority.  As part of our continuing mission to provide you with exceptional heart care, we have created designated Provider Care Teams.  These Care Teams include your primary Cardiologist (physician) and Advanced Practice Providers (APPs -  Physician Assistants and Nurse Practitioners) who all work together to provide you with the care you need, when you need it. . You will need a follow up appointment in 12 months.  Please call our office 2 months in advance to schedule this appointment.  You may see Glenetta Hew, MD or one of the following Advanced Practice Providers on your designated Care Team:   . Rosaria Ferries, PA-C . Jory Sims, DNP, ANP  Any Other Special Instructions Will Be Listed Below (If Applicable).  Wear your support stockings - and elevate your legs while resting.   place your  DO NOT  RESUSCITATE ORDER IN PROMINENT PLACE AT [facility].

## 2019-01-02 NOTE — Progress Notes (Signed)
PCP: Ardith DarkParker, Caleb M, MD  Formerly a pt. Of Dr. Donnie Ahoilley  Clinic Note: Chief Complaint  Patient presents with   New Patient (Initial Visit)    Establish new cardiology care.  Former Dr. Donnie Ahoilley patient   Congestive Heart Failure    Hypertensive heart disease with diastolic dysfunction and intermittent diastolic heart failure    HPI: Warren White is a 85 y.o. male (long-term pipe smoker) with COPD and PAD per lower extremity arterial Dopplers as well as abnormal echocardiogram by who is being seen today for the evaluation of shortness of breath and cough at the request of Ardith DarkParker, Caleb M, MD.  --Warren White had previously been followed by Dr. Donnie Ahoilley for what was described as hypertensive heart disease "with diastolic heart failure ".  He was last seen by Dr. Donnie Ahoilley in April 2019.  At that time the indication was that he was able to play golf and swim (and was "over his dyspnea at that time ".  He had tolerated increased dose of metoprolol.  Had not have any symptoms of angina or PND, orthopnea to suggest heart failure. --Dr. York Spanielilley's note indicates Noted improved cardiomyopathy based on an echocardiogram done in his practice showing EF of 55% which was an improvement from October 2018 Echo showing EF of 40 to 44%.  Warren White was last seen on Nov 02, 2018 by Dr. Jimmey RalphParker with complaints of worsening cough with wheeze and sputum production along with lethargy for the last several months.  No worsening edema.  No worsening with exertion. --> He was then seen by Angus Selleronya Nichols, NP at [facility] for follow-up after April 29 visit with Dr. Isaiah SergeMannam. -->  Patient was not interested in using inhalers.  Recent Hospitalizations: None  Studies Personally Reviewed - (if available, images/films reviewed: From Epic Chart or Care Everywhere)  LAA Dopplers October 2019: Right ABI suggests calcified vessels with abnormal toe brachial index.  Left ABI indicates mild disease-left  iliac..  Echocardiogram October 2018 (Dr. Sharyn LullHarwani): EF estimated 40 and 44% with global hypokinesis.  Impaired relaxation and mild concentric LVH.  Normal RV size.  Mild LA dilation.  LVEF of 55% documented via echocardiogram on 07/03/2017,  moderate LVH.  Interval History: Warren White Presents here today to establish cardiology care, transferring from Dr. Donnie Ahoilley.  Last seen in April 2019.  He denies having any chest pain or pressure with rest or exertion, but he self-directed very difficult for him to breathe when he bends over, or does any kind of activity including simply walking around the room.  He does not wear oxygen all the time (not wearing it today) despite being hypoxic on evaluation today.  He tells me that he is breathing okay at rest (albeit using accessory muscles), but is noted to start walking on 2 steps, to get short of breath.  He also notes that was a pressure sensation in his bladder and a severe urinary and also short of breath.   He has chronic lower extremity swelling this really does not seem to be bothering him much.  He has venous stasis changes with some varicose veins, and notes significant restless leg syndrome however.  This seems given left leg a lot at night.  He is a poorly healing ulcer on the right foot that makes it difficult for him to walk as well.  He says the leg swelling does get better when he elevates his legs, but really does not notice much of a difference whether he takes the  furosemide at home.  But he notes that he just urinates a lot and that is hard for him to get up with rounds of much.  He denies any sensation of a rapid rate heartbeats palpitations.  He may a little bit dizzy or lightheaded when he is first tries to walk because of dyspnea, but denies any syncope or near syncope.  He has a pretty unsteady gait with poor balance, but has not had any falls recently.  He used to swim and play golf, but now is not able to really play golf that much  anymore.  He does try to go to the gym 1 day a week which is by appointment.   No TIA/amaurosis fugax symptoms. No melena, hematochezia, hematuria, or epstaxis. No claudication.  He does not like to take his Lasix every day, and oftentimes does not.  Although scheduled twice daily, he barely takes it once a day.  He also has not been taking his Entresto with any frequency at all.  When he does take it does not take it twice daily.  He says when he takes it he feels very dizzy.   ROS: A comprehensive was performed. Review of Systems  Constitutional: Positive for malaise/fatigue. Negative for chills, fever and weight loss.  HENT: Positive for congestion (Off and on).   Respiratory: Positive for cough (Mostly in the morning) and shortness of breath (Not at rest, but with any activity).   Cardiovascular: Positive for leg swelling (Stable, does not change whether he takes Lasix or not.). Negative for orthopnea and PND.  Genitourinary: Positive for frequency (Especially when he takes Lasix). Negative for dysuria.  Musculoskeletal: Positive for back pain and joint pain. Negative for falls (Has not had any recent falls.).  Neurological: Positive for dizziness (Orthostatic, and anytime he bends over.), tremors and weakness (Global, especially legs).  Psychiatric/Behavioral: Negative for memory loss. The patient is nervous/anxious (Has intermittent panic with not being able to breathe). The patient does not have insomnia.   All other systems reviewed and are negative.   The patient does not have symptoms concerning for COVID-19 infection (fever, chills, cough, or new shortness of breath).  - shortness of breath is being evaluated by PCCM. The patient is practicing social distancing.   COVID-19 Education: The signs and symptoms of COVID-19 were discussed with the patient and how to seek care for testing (follow up with PCP or arrange E-visit).   The importance of social distancing was discussed  today.   I have reviewed and (if needed) personally updated the patient's problem list, medications, allergies, past medical and surgical history, social and family history.   Past Medical History:  Diagnosis Date   COPD (chronic obstructive pulmonary disease) (HCC)    Emphysema of lung (HCC)    GERD (gastroesophageal reflux disease)    Hypertensive heart disease with chronic diastolic congestive heart failure (HCC) 03/2017   However no edema noted.  EF had improved from 40 and 45% to 50-55% by echo in January 2019.  Moderate LVH with diastolic dysfunction noted.   Psoriasis    Restless leg     Past Surgical History:  Procedure Laterality Date   BACK SURGERY     CHOLECYSTECTOMY     ROTATOR CUFF REPAIR Bilateral    TOTAL HIP ARTHROPLASTY Left    TRANSTHORACIC ECHOCARDIOGRAM  03/2017   Insetting of what looked like pneumonia/COPD exacerbation: (Dr. Sharyn LullHarwani): EF estimated 40 and 44% with global hypokinesis.  Impaired relaxation and mild concentric LVH.  Normal RV size.  Mild LA dilation.   TRANSTHORACIC ECHOCARDIOGRAM  06/2017   Dr. Donnie Aho: EF roughly 55%.  Moderate LVH.  No details of diastolic function noted, likely diastolic dysfunction grade 1-2.    Current Meds  Medication Sig   albuterol (PROVENTIL HFA;VENTOLIN HFA) 108 (90 Base) MCG/ACT inhaler Inhale 2 puffs into the lungs every 6 (six) hours as needed for wheezing or shortness of breath.   cholecalciferol (VITAMIN D) 1000 units tablet Take 1,000 Units by mouth every Monday, Wednesday, and Friday.   clobetasol cream (TEMOVATE) 0.05 % Apply 1 application topically 2 (two) times daily.   Cyanocobalamin (VITAMIN B-12 PO) Take 1 tablet by mouth every Monday, Wednesday, and Friday.   dextromethorphan-guaiFENesin (MUCINEX DM) 30-600 MG 12hr tablet Take 1 tablet by mouth 2 (two) times daily.   Ferrous Sulfate (IRON) 325 (65 Fe) MG TABS Take 1 tablet by mouth every Monday, Wednesday, and Friday.   furosemide  (LASIX) 40 MG tablet TAKE 40 MG  Tablet Monday , Wednesday,Fridays - and  If weight gain of 3 lbs, or shortness of breathe , or swelling as needed   glucosamine-chondroitin 500-400 MG tablet Take 3 tablets by mouth every Monday, Wednesday, and Friday.   ipratropium-albuterol (DUONEB) 0.5-2.5 (3) MG/3ML SOLN USE 1 VIAL IN NEBULIZER 3 TIMES DAILY. Generic: DUONEB   MAGNESIUM PO Take 1 tablet by mouth every Monday, Wednesday, and Friday.   metoprolol succinate (TOPROL-XL) 25 MG 24 hr tablet Take 1 tablet (25 mg total) by mouth daily.   Multiple Vitamin (MULTIVITAMIN WITH MINERALS) TABS tablet Take 1 tablet by mouth every Monday, Wednesday, and Friday.   mupirocin ointment (BACTROBAN) 2 % Apply to wound three times a day   naproxen sodium (ANAPROX) 220 MG tablet Take 440 mg by mouth 2 (two) times a week.    Omega-3 Fatty Acids (FISH OIL) 1000 MG CAPS Take 1 capsule by mouth every Monday, Wednesday, and Friday.   pantoprazole (PROTONIX) 40 MG tablet TAKE 1 TABLET ONCE DAILY IN THE EVENING.   pramipexole (MIRAPEX) 0.5 MG tablet TAKE 1/2 TABLET TWICE DAILY. MAY TAKE AN ADDITIONAL 1/2 TABLET IF NEEDED.   traZODone (DESYREL) 100 MG tablet TAKE 2 TABLETS AT BEDTIME.   vitamin C (ASCORBIC ACID) 500 MG tablet Take 500 mg by mouth every Monday, Wednesday, and Friday.   [DISCONTINUED] furosemide (LASIX) 40 MG tablet Take 1 tablet (40 mg total) by mouth 2 (two) times daily.   [DISCONTINUED] furosemide (LASIX) 40 MG tablet TAKE 40 MG  Tablet Monday , Wednesday,Fridays - and  If weight gain of 3 lbs, or shortness of breathe , or swelling as needed   [DISCONTINUED] metoprolol succinate (TOPROL-XL) 25 MG 24 hr tablet Take 25 mg by mouth daily.   [DISCONTINUED] sacubitril-valsartan (ENTRESTO) 24-26 MG Take 1 tablet by mouth 2 (two) times daily. (Patient taking differently: Take 1 tablet by mouth daily. )    No Known Allergies  Social History   Tobacco Use   Smoking status: Current Every Day  Smoker    Types: Pipe   Smokeless tobacco: Never Used  Substance Use Topics   Alcohol use: Yes    Alcohol/week: 14.0 standard drinks    Types: 14 Standard drinks or equivalent per week    Comment: occ   Drug use: No   Social History   Social History Narrative   Not on file    family history includes Cataracts in his mother; Restless legs syndrome in his mother.  Wt Readings from Last  3 Encounters:  01/02/19 200 lb 3.2 oz (90.8 kg)  12/19/18 201 lb 12.8 oz (91.5 kg)  04/16/18 194 lb 2 oz (88.1 kg)    PHYSICAL EXAM BP (!) 100/58    Pulse 91    Temp (!) 97.3 F (36.3 C) (Temporal)    Ht  (1.727 m)    Wt 200 lb 3.2 oz (90.8 kg)    SpO2 (!) 87%    BMI 30.44 kg/m  Physical Exam  Constitutional: He is oriented to person, place, and time. He appears well-nourished. No distress (Seems chronically ill, nontoxic but not acutely distressed).  Chronically ill-appearing elderly gentleman.  Increased accessory muscle use for breathing noted at baseline.  Although does not appear to be in distress.  Did feel very dyspneic walking back to the clinic room (not currently on his oxygen).  Significant truncal obesity.  HENT:  Head: Normocephalic and atraumatic.  Eyes: EOM are normal.  Neck: Normal range of motion. Neck supple. No hepatojugular reflux and no JVD present. Carotid bruit is not present.  Cardiovascular: Normal rate, regular rhythm, S1 normal and S2 normal.  Occasional extrasystoles are present. PMI is not displaced (Very difficult to assess). Exam reveals distant heart sounds and decreased pulses (Decreased but palpable pedal pulses.). Exam reveals no gallop and no friction rub.  No murmur heard. May have split S2, but difficult to hear.  Mostly masked by wheezing/rhonchi  Pulmonary/Chest: He has no rales. He exhibits no tenderness.  Bucket-handle breathing with accessory muscle use, but no obvious stress or distress. Diffuse coarse expiratory wheezes and rhonchi.   Abdominal: Soft. Bowel sounds are normal. There is abdominal tenderness (Along the site of the diastases recti/ventral hernia). There is no rebound.  Significant truncal obesity with protuberant abdomen with diastases recti  Musculoskeletal: Normal range of motion.        General: No edema (Trivial with mild ankle edema).  Neurological: He is alert and oriented to person, place, and time.  Skin: Skin is warm and dry.  Diffuse coarse brawny venous stasis dermatitis noted on bilateral lower legs.  Left worse than right.  Right foot on the high instep has a half dime sized ulcer that appears to be chronic but not fully healed.  Somewhat erythematous around it, but no warmth.  Psychiatric: He has a normal mood and affect. His behavior is normal. Judgment and thought content normal.  Mixed personality -  Cantankerous but pleasant     Adult ECG Report  Rate: 91 ;  Rhythm: normal sinus rhythm, premature ventricular contractions (PVC) and IVCD with Non-specific T wave changes.;   Narrative Interpretation: NO significant change from 2018 EKG in Epic   Other studies Reviewed: Additional studies/ records that were reviewed today include:  Recent Labs:   Lab Results  Component Value Date   CREATININE 1.03 12/19/2018   BUN 25 (H) 12/19/2018   NA 142 12/19/2018   K 4.7 12/19/2018   CL 103 12/19/2018   CO2 32 12/19/2018    ASSESSMENT / PLAN: Problem List Items Addressed This Visit    Venous stasis dermatitis without varicosities (Chronic)    Would probably benefit from support stockings and or Unaboot.  The only issue would be addressing the wound on his right foot.  May benefit from wound care evaluation to help with dressing recommendations and possible Unaboot.  I do not know how much of this is related to his diuretic need and think is probably fine for him to just take his  Lasix once daily.  He is can apply take it 3 days a week for little while and see how he does.  I recommend that he  takes it Monday Wednesday and Friday and then additional doses as needed.      RLS (restless legs syndrome) (Chronic)    This seems to be driving him crazy.  He is on Mirapex.  But my suspicion is the restless legs related to his venous stasis changes.  Hopefully if he was to be able to wear support stockings and/or Unaboot.  They will help some.  This contributes greatly to his decreased quality of life.  I do not think that any vascular procedures will be warranted to help him out though.      Peripheral artery disease (HCC) (Chronic)   Relevant Medications   furosemide (LASIX) 40 MG tablet   metoprolol succinate (TOPROL-XL) 25 MG 24 hr tablet   RESOLVED: Hypertensive heart disease without CHF (Chronic)   Relevant Medications   furosemide (LASIX) 40 MG tablet   metoprolol succinate (TOPROL-XL) 25 MG 24 hr tablet   Other Relevant Orders   EKG 12-Lead (Completed)   Encounter for palliative care    Warren White has chronic end-stage COPD, still smokes his pipe.  He has significant increased work of breathing just simply at rest.  Any walking at all makes him dyspneic.  I do not think any of this is related to "CHF besides the fact that the heart is not happy with his hypoxemia.  He is hypoxic today with oxygen saturations of 87%.  This is not related to cardiac etiology at all. He has chronic venous stasis with chronic, nonhealing right foot ulcer that thankfully does not appear to have any signs of cellulitis associated with it.  This is probably as much related to poor venous drainage as it is related to arterial flow.  He would not be interested in any procedures.  He is not interested in any Cardiac procedures to assess for cardiac etiology for his dyspnea.  Not having any angina. Thankfully, his daughter was with him today, and we had a frank discussion between 3 of us as far as his desires and wishes for his long-term care.  He does have a living will that states that he would not want to  have resuscitation efforts, but he does not have a DNR order at home.  We discussed what that means and he agrees that he would not want to have CPR or intubation.  I have therefore signed a DNR order for him.  He would prefer not to be rehospitalized, but I think this will be something that would require different documentation including the "MOST form "      Chronic diastolic CHF (congestive heart failure) (HCC) - Primary (Chronic)    He does really poorly with taking Entresto, and is probably not taking it very routinely.  At this point his blood pressure is low, his EF is back to normal.  I see no reason to continue Entresto.  We will simply discontinue Entresto but continue his metoprolol.  I do not think that his leg swelling is at all related to left-sided heart failure, if anything may be related to right-sided failure from COPD. Changing Lasix to Monday Wednesday Friday with Ovace as needed based on weight change.      Relevant Medications   furosemide (LASIX) 40 MG tablet   metoprolol succinate (TOPROL-XL) 25 MG 24 hr tablet   Other Relevant Orders  EKG 12-Lead (Completed)      I spent a total of 45 minutes with the patient and chart review. >  50% of the time was spent in direct patient consultation.  I spent an additional 15 to 20 minutes reviewing his old chart, and physician notes etc. Of the 45 minutes spent in direct patient contact, 15 minutes was spent essentially with end-of-life/goals of care discussion.  Current medicines are reviewed at length with the patient today.  (+/- concerns) had issues with medications including Entresto and diuretic.   The following changes have been made:  See below  Patient Instructions  Medication Instructions:  Stop taking  Entresto Continue  Taking Metoprolol  Start taking Lasix ( furosemide)  Mondays - Wednesdays -Fridays if  you have any weight gain greater than 3 lbs ,swelling or shortness of breathe especially laying down  Take  your lasix ( furosemide) fluid pill.  If you need a refill on your cardiac medications before your next appointment, please call your pharmacy.   Lab work:  Not needed  Testing/Procedures: Not needed  Follow-Up: At [facility], you and your health needs are our priority.  As part of our continuing mission to provide you with exceptional heart care, we have created designated Provider Care Teams.  These Care Teams include your primary Cardiologist (physician) and Advanced Practice Providers (APPs -  Physician Assistants and Nurse Practitioners) who all work together to provide you with the care you need, when you need it.  You will need a follow up appointment in 12 months.  Please call our office 2 months in advance to schedule this appointment.  You may see Glenetta Hew, MD or one of the following Advanced Practice Providers on your designated Care Team:    Rosaria Ferries, PA-C  Jory Sims, DNP, ANP  Any Other Special Instructions Will Be Listed Below (If Applicable).  Wear your support stockings - and elevate your legs while resting.   place your  DO NOT  RESUSCITATE ORDER IN PROMINENT PLACE AT [facility].    Studies Ordered:   Orders Placed This Encounter  Procedures   EKG 12-Lead      Glenetta Hew, M.D., M.S. Interventional Cardiologist   Pager # [phone] Phone # [phone] [address] [place], [place] [address]   Thank you for choosing Heartcare at [facility]!!

## 2019-01-04 ENCOUNTER — Encounter: Payer: Self-pay | Admitting: Cardiology

## 2019-01-04 DIAGNOSIS — I872 Venous insufficiency (chronic) (peripheral): Secondary | ICD-10-CM | POA: Insufficient documentation

## 2019-01-04 NOTE — Assessment & Plan Note (Signed)
Mr. Blue has chronic end-stage COPD, still smokes his pipe.  He has significant increased work of breathing just simply at rest.  Any walking at all makes him dyspneic.  I do not think any of this is related to "CHF besides the fact that the heart is not happy with his hypoxemia.  He is hypoxic today with oxygen saturations of 87%.  This is not related to cardiac etiology at all. He has chronic venous stasis with chronic, nonhealing right foot ulcer that thankfully does not appear to have any signs of cellulitis associated with it.  This is probably as much related to poor venous drainage as it is related to arterial flow.  He would not be interested in any procedures.  He is not interested in any Cardiac procedures to assess for cardiac etiology for his dyspnea.  Not having any angina. Thankfully, his daughter was with him today, and we had a frank discussion between 3 of [place] as far as his desires and wishes for his long-term care.  He does have a living will that states that he would not want to have resuscitation efforts, but he does not have a DNR order at home.  We discussed what that means and he agrees that he would not want to have CPR or intubation.  I have therefore signed a DNR order for him.  He would prefer not to be rehospitalized, but I think this will be something that would require different documentation including the "MOST form "

## 2019-01-04 NOTE — Assessment & Plan Note (Signed)
This seems to be driving him crazy.  He is on Mirapex.  But my suspicion is the restless legs related to his venous stasis changes.  Hopefully if he was to be able to wear support stockings and/or Unaboot.  They will help some.  This contributes greatly to his decreased quality of life.  I do not think that any vascular procedures will be warranted to help him out though.

## 2019-01-04 NOTE — Assessment & Plan Note (Signed)
He does really poorly with taking Entresto, and is probably not taking it very routinely.  At this point his blood pressure is low, his EF is back to normal.  I see no reason to continue Entresto.  We will simply discontinue Entresto but continue his metoprolol.  I do not think that his leg swelling is at all related to left-sided heart failure, if anything may be related to right-sided failure from COPD. Changing Lasix to Monday Wednesday Friday with Ovace as needed based on weight change.

## 2019-01-04 NOTE — Assessment & Plan Note (Signed)
Would probably benefit from support stockings and or Unaboot.  The only issue would be addressing the wound on his right foot.  May benefit from wound care evaluation to help with dressing recommendations and possible Unaboot.  I do not know how much of this is related to his diuretic need and think is probably fine for him to just take his Lasix once daily.  He is can apply take it 3 days a week for little while and see how he does.  I recommend that he takes it Monday Wednesday and Friday and then additional doses as needed.

## 2019-01-26 ENCOUNTER — Other Ambulatory Visit: Payer: Self-pay | Admitting: Family Medicine

## 2019-03-10 ENCOUNTER — Other Ambulatory Visit: Payer: Self-pay | Admitting: Family Medicine

## 2019-03-23 ENCOUNTER — Other Ambulatory Visit: Payer: Self-pay | Admitting: Family Medicine

## 2019-04-01 DIAGNOSIS — Z23 Encounter for immunization: Secondary | ICD-10-CM | POA: Diagnosis not present

## 2019-04-29 ENCOUNTER — Other Ambulatory Visit: Payer: Self-pay | Admitting: Family Medicine

## 2019-05-31 ENCOUNTER — Encounter: Payer: Self-pay | Admitting: Pulmonary Disease

## 2019-05-31 ENCOUNTER — Ambulatory Visit (INDEPENDENT_AMBULATORY_CARE_PROVIDER_SITE_OTHER): Payer: Medicare Other

## 2019-05-31 ENCOUNTER — Other Ambulatory Visit: Payer: Self-pay

## 2019-05-31 ENCOUNTER — Ambulatory Visit (INDEPENDENT_AMBULATORY_CARE_PROVIDER_SITE_OTHER): Payer: Medicare Other | Admitting: Pulmonary Disease

## 2019-05-31 VITALS — BP 128/80 | HR 78 | Temp 97.8°F | Ht 68.0 in | Wt 211.6 lb

## 2019-05-31 DIAGNOSIS — J449 Chronic obstructive pulmonary disease, unspecified: Secondary | ICD-10-CM

## 2019-05-31 MED ORDER — TRELEGY ELLIPTA 100-62.5-25 MCG/INH IN AEPB: 1.0000 | INHALATION_SPRAY | Freq: Every day | RESPIRATORY_TRACT | 0 refills | Status: AC

## 2019-05-31 MED ORDER — PREDNISONE 20 MG PO TABS: 40.0000 mg | ORAL_TABLET | Freq: Every day | ORAL | 0 refills | Status: DC

## 2019-05-31 NOTE — Progress Notes (Signed)
Warren White    [id number]    10-15-1925  Primary Care Physician:Parker, Katina Degree, MD  Referring Physician: Ardith Dark, MD [address] [place],  [place] [address]  Chief complaint: Follow-up for COPD, recurrent pneumonia  HPI: 85 year old with past medical history of COPD, emphysema, GERD.   Seen in pulmonary clinic after admission on 02/02/17 with COPD exacerbation. He has been started on nebulizers, antibiotics, steroids. He initially was on BiPAP in stepdown. He was eventually transitioned to a floor bed. Started on inhalers on discharge which he self discontinued since he did not want to use them He has maintained fairly active lifestyle with swimming and golf  Also noted to have cardiomegaly with echocardiogram showing EF 35-40 percent on that admission. On lasix as outpt. Since his discharge from the hospital he had another episode of pneumonia in December 2019.  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 He continues to smoke.  Interim history: Complains of increasing dyspnea, cough with white mucus, congestion for the past few months Denies any fevers, chills  Outpatient Encounter Medications as of 05/31/2019  Medication Sig  . albuterol (PROVENTIL HFA;VENTOLIN HFA) 108 (90 Base) MCG/ACT inhaler Inhale 2 puffs into the lungs every 6 (six) hours as needed for wheezing or shortness of breath.  . dextromethorphan-guaiFENesin (MUCINEX DM) 30-600 MG 12hr tablet Take 1 tablet by mouth 2 (two) times daily.  . furosemide (LASIX) 40 MG tablet TAKE 40 MG  Tablet Monday , Wednesday,Fridays - and  If weight gain of 3 lbs, or shortness of breathe , or swelling as needed  . ipratropium-albuterol (DUONEB) 0.5-2.5 (3) MG/3ML SOLN USE 1 VIAL IN NEBULIZER 3 TIMES DAILY.  Generic: DUONEB  . metoprolol succinate (TOPROL-XL) 25 MG 24 hr tablet Take 1 tablet (25 mg total) by mouth daily.  . pantoprazole (PROTONIX) 40 MG tablet TAKE 1 TABLET ONCE DAILY IN THE EVENING.  . pramipexole (MIRAPEX) 0.5 MG tablet TAKE 1/2 TABLET TWICE DAILY. MAY TAKE AN ADDITIONAL 1/2 TABLET IF NEEDED.  Marland Kitchen traZODone (DESYREL) 100 MG tablet TAKE 2 TABLETS AT BEDTIME.  . [DISCONTINUED] cholecalciferol (VITAMIN D) 1000 units tablet Take 1,000 Units by mouth every Monday, Wednesday, and Friday.  . [DISCONTINUED] clobetasol cream (TEMOVATE) 0.05 % Apply 1 application topically 2 (two) times daily.  . [DISCONTINUED] Cyanocobalamin (VITAMIN B-12 PO) Take 1 tablet by mouth every Monday, Wednesday, and Friday.  . [DISCONTINUED] Ferrous Sulfate (IRON) 325 (65 Fe) MG TABS Take 1 tablet by mouth every Monday, Wednesday, and Friday.  . [DISCONTINUED] glucosamine-chondroitin 500-400 MG tablet Take 3 tablets by mouth every Monday, Wednesday, and Friday.  . [DISCONTINUED] MAGNESIUM PO Take 1 tablet by mouth every Monday, Wednesday, and Friday.  . [DISCONTINUED] Multiple Vitamin (MULTIVITAMIN WITH MINERALS) TABS tablet Take 1 tablet by mouth every Monday, Wednesday, and Friday.  . [DISCONTINUED] Omega-3 Fatty Acids (FISH OIL) 1000 MG CAPS Take 1 capsule by mouth every Monday, Wednesday, and Friday.  . [DISCONTINUED] vitamin C (ASCORBIC ACID) 500 MG tablet Take 500 mg by mouth every Monday, Wednesday, and Friday.  . [DISCONTINUED] mupirocin ointment (BACTROBAN) 2 % Apply to wound three times a day  . [DISCONTINUED] naproxen sodium (ANAPROX) 220 MG tablet Take 440 mg by mouth 2 (two) times a week.  No facility-administered encounter medications on file as of 05/31/2019.    Physical Exam: Blood pressure 128/80, pulse 78, temperature 97.8 F (36.6 C), temperature source Temporal, height 5\' 8"  (1.727 m), weight 211 lb 9.6 oz (96 kg), SpO2 91 %. Gen:      Moderate respiratory HEENT:  EOMI, sclera anicteric  Neck:     No masses; no thyromegaly Lungs:    Bilateral expiratory wheeze, crackles CV:         Regular rate and rhythm; no murmurs Abd:      + bowel sounds; soft, non-tender; no palpable masses, no distension Ext:    No edema; adequate peripheral perfusion Skin:      Warm and dry; no rash Neuro: alert and oriented x 3 Psych: normal mood and affect  Data Reviewed: Imaging: Chest x-ray 02/02/17- cardiomegaly, hyperinflated lungs with mild bronchitis changes. Chest x-ray 04/10/17-lower lobe segmental atelectasis Chest x-ray 05/15/17-improvement in left base opacity Chest x-ray 06/15/17- new left midlung and lower lobe consolidation Chest x-ray1/30/2019-improving left lung consolidation Chest x-ray 10/23/2017- No residual pneumonia I reviewed the images personally  Spirometry 07/26/17 FVC 2.04 [2%], FEV1 1.05 [47%], F/F 51 Severe airway obstruction  Labs:  CBC 12/19/2018-WBC 6, eos 7.7%, absolute eosinophil count 462  Cardiac: Echocardiogram 02/03/17 > LVEF 35-40 percent, diffuse hypokinesis, grade 1 diastolic dysfunction  Assessment:  Severe COPD, chronic bronchitis Has worsening respiratory status with wheezing consistent with COPD exacerbation He had previously refused inhalers and took himself off Anoro.  Currently just on duo nebs  We will give him a prednisone course of 40 mg for 5 days Restart inhalers with Trelegy.  He will benefit from inhaled corticosteroid as he has peripheral eosinophilia Get chest x-ray to rule out any infiltrate Use Mucinex and flutter valve as needed for clearance of secretion Discussed smoking cessation but he does not want to stop cigars.  Code status  DNR.    Health maintenance 03/31/2019-influenza 04/10/2017- Prevnar 13 04/24/2018-Pneumovax  Plan/Recommendations: - Start Trelegy, continue duo nebs - Prednisone 40 mg a day for 5 days - Chest x-ray - Mucinex, fluter valve  Marshell Garfinkel MD [facility] 05/31/2019,  11:54 AM  CC: Vivi Barrack, MD

## 2019-05-31 NOTE — Patient Instructions (Signed)
We will get a chest x-ray today We will give prednisone 40 mg a day for 5 days We will start you on Trelegy inhaler Continue the duo nebs Follow-up in 1 month.

## 2019-06-03 ENCOUNTER — Telehealth: Payer: Self-pay

## 2019-06-03 DIAGNOSIS — R911 Solitary pulmonary nodule: Secondary | ICD-10-CM

## 2019-06-03 NOTE — Telephone Encounter (Signed)
I called and spoke with the patient's daughter and made her aware. She states that she will make him aware. She verbalized understanding. I have placed an order for the CT scan.

## 2019-06-03 NOTE — Telephone Encounter (Signed)
-----   Message from Marshell Garfinkel, MD sent at 06/03/2019 10:45 AM EST ----- Chest x-ray shows a new lung nodule Please order CT chest without contrast for further evaluation.

## 2019-06-10 ENCOUNTER — Ambulatory Visit
Admission: RE | Admit: 2019-06-10 | Discharge: 2019-06-10 | Disposition: A | Discharge status: Home / Self Care | Payer: Medicare Other | Source: Ambulatory Visit | Attending: Pulmonary Disease | Admitting: Pulmonary Disease

## 2019-06-10 DIAGNOSIS — R911 Solitary pulmonary nodule: Secondary | ICD-10-CM

## 2019-06-13 ENCOUNTER — Telehealth: Payer: Self-pay

## 2019-06-13 DIAGNOSIS — R911 Solitary pulmonary nodule: Secondary | ICD-10-CM

## 2019-06-13 MED ORDER — AZITHROMYCIN 250 MG PO TABS: 250.0000 mg | ORAL_TABLET | Freq: Every day | ORAL | 0 refills | Status: DC

## 2019-06-13 NOTE — Telephone Encounter (Signed)
-----   Message from Marshell Garfinkel, MD sent at 06/13/2019  3:30 PM EST ----- I called and reviewed the scan with patient's daughterPlease call in a Z-Pak and CT scan without contrast for nodule follow-up in 4 to 6 weeks

## 2019-06-13 NOTE — Telephone Encounter (Signed)
Z-pack sent to pharmacy and CT scan ordered to be done in 4-6 weeks.

## 2019-06-22 ENCOUNTER — Other Ambulatory Visit: Payer: Self-pay | Admitting: Family Medicine

## 2019-07-22 ENCOUNTER — Other Ambulatory Visit: Payer: Self-pay | Admitting: Family Medicine

## 2019-07-24 ENCOUNTER — Telehealth: Payer: Self-pay | Admitting: Family Medicine

## 2019-07-24 ENCOUNTER — Ambulatory Visit
Admission: RE | Admit: 2019-07-24 | Discharge: 2019-07-24 | Disposition: A | Discharge status: Home / Self Care | Payer: Medicare Other | Source: Ambulatory Visit | Attending: Pulmonary Disease | Admitting: Pulmonary Disease

## 2019-07-24 DIAGNOSIS — R911 Solitary pulmonary nodule: Secondary | ICD-10-CM

## 2019-07-24 DIAGNOSIS — J984 Other disorders of lung: Secondary | ICD-10-CM | POA: Diagnosis not present

## 2019-07-24 DIAGNOSIS — J439 Emphysema, unspecified: Secondary | ICD-10-CM | POA: Diagnosis not present

## 2019-07-24 NOTE — Telephone Encounter (Signed)
Patients daughter called in on behalf of her father wanting to know if Dr.parker could prescribe him an antidepressant, due to him feeling really down recently dealing with his COPD and heart failure

## 2019-07-24 NOTE — Telephone Encounter (Signed)
Informed daughter to schedule appt.Please advise

## 2019-07-24 NOTE — Telephone Encounter (Signed)
[phone] if daughter does not answer then to call this number, daughter asked if it could be today that thye hear something.

## 2019-07-25 ENCOUNTER — Ambulatory Visit (INDEPENDENT_AMBULATORY_CARE_PROVIDER_SITE_OTHER): Payer: Medicare Other | Admitting: Family Medicine

## 2019-07-25 ENCOUNTER — Other Ambulatory Visit: Payer: Self-pay | Admitting: Family Medicine

## 2019-07-25 DIAGNOSIS — R4589 Other symptoms and signs involving emotional state: Secondary | ICD-10-CM | POA: Diagnosis not present

## 2019-07-25 MED ORDER — BUPROPION HCL ER (XL) 150 MG PO TB24: 150.0000 mg | ORAL_TABLET | Freq: Every day | ORAL | 5 refills | Status: DC

## 2019-07-25 NOTE — Telephone Encounter (Signed)
Noted  

## 2019-07-25 NOTE — Telephone Encounter (Signed)
Will address at OV today.  Katina Degree. Jimmey Ralph, MD 07/25/2019 8:00 AM

## 2019-07-25 NOTE — Assessment & Plan Note (Signed)
No red flags.  Will start Wellbutrin 150 mg daily given lack of energy and motivation.  Discussed potential side effects.  He will follow-up me in a few weeks.

## 2019-07-25 NOTE — Progress Notes (Signed)
   Warren White is a 86 y.o. male who presents today for a telephone visit.  Assessment/Plan:  Chronic Problems Addressed Today: Depressed mood No red flags.  Will start Wellbutrin 150 mg daily given lack of energy and motivation.  Discussed potential side effects.  He will follow-up me in a few weeks.     Subjective:  HPI:  Patient has had been having increased issues with motivation and fatigue for the past several weeks. He thinks that his overall decline in physical health is causing it. No reported SI or HI. He is sleeping ok. Appetite has been ok.        Objective/Observations   NAD  Telephone Visit   I connected with Jamario Alf on 07/25/19 at  3:40 PM EST via telephone and verified that I am speaking with the correct person using two identifiers. I discussed the limitations of evaluation and management by telemedicine and the availability of in person appointments. The patient expressed understanding and agreed to proceed.   Patient location: Home Provider location: [place] [employer] Persons participating in the virtual visit: Myself and Patient  A total of 7 minutes were spent on medical discussion.      Katina Degree. Jimmey Ralph, MD 07/25/2019 3:50 PM

## 2019-07-28 ENCOUNTER — Ambulatory Visit: Payer: Self-pay

## 2019-08-01 ENCOUNTER — Other Ambulatory Visit: Payer: Self-pay | Admitting: *Deleted

## 2019-08-01 DIAGNOSIS — R918 Other nonspecific abnormal finding of lung field: Secondary | ICD-10-CM

## 2019-08-02 ENCOUNTER — Telehealth: Payer: Self-pay | Admitting: Pulmonary Disease

## 2019-08-02 NOTE — Telephone Encounter (Signed)
Warren Greathouse, MD  07/31/2019 2:22 PM EST    CT shows the lung nodule in the left lung has improved which is good news. There are other new small lung nodules that we need to keep monitoring Order CT chest without contrast in 3 months.  There is also nodules in the thyroid for which he will require a thyroid ultrasound. He can follow-up with his primary care regarding this or if he wishes we can order the ultrasound.   Called and spoke with pt's daughter Warren White letting her know the info stated by Dr. Isaiah Serge about pt's CT results and she verbalized understanding stating she would call PCP in regards to pt's thyroid to see if they want to order a thyroid ultrasound. Nothing further needed.

## 2019-08-03 ENCOUNTER — Ambulatory Visit: Payer: Medicare Other | Attending: Internal Medicine

## 2019-08-03 DIAGNOSIS — Z23 Encounter for immunization: Secondary | ICD-10-CM | POA: Insufficient documentation

## 2019-08-03 NOTE — Progress Notes (Signed)
   Covid-19 Vaccination Clinic  Name:  Warren White    MRN: [id number] DOB: 21-Aug-1925  08/03/2019  Warren White was observed post Covid-19 immunization for 15 minutes without incidence. He was provided with Vaccine Information Sheet and instruction to access the V-Safe system.   Warren White was instructed to call 911 with any severe reactions post vaccine: Marland Kitchen Difficulty breathing  . Swelling of your face and throat  . A fast heartbeat  . A bad rash all over your body  . Dizziness and weakness    Immunizations Administered    Name Date Dose VIS Date Route   Pfizer COVID-19 Vaccine 08/03/2019  2:14 PM 0.3 mL 06/07/2019 Intramuscular   Manufacturer: [employer], [employer]   Lot: [id number]   NDC: [address]-8

## 2019-08-08 ENCOUNTER — Ambulatory Visit: Payer: Self-pay

## 2019-08-19 ENCOUNTER — Other Ambulatory Visit: Payer: Self-pay | Admitting: Family Medicine

## 2019-08-19 ENCOUNTER — Encounter: Payer: Self-pay | Admitting: Family Medicine

## 2019-08-19 ENCOUNTER — Ambulatory Visit (INDEPENDENT_AMBULATORY_CARE_PROVIDER_SITE_OTHER): Payer: Medicare Other | Admitting: Family Medicine

## 2019-08-19 ENCOUNTER — Other Ambulatory Visit: Payer: Self-pay

## 2019-08-19 VITALS — BP 142/86 | HR 86 | Temp 97.0°F | Ht 68.0 in | Wt 212.2 lb

## 2019-08-19 DIAGNOSIS — E041 Nontoxic single thyroid nodule: Secondary | ICD-10-CM

## 2019-08-19 DIAGNOSIS — R4589 Other symptoms and signs involving emotional state: Secondary | ICD-10-CM

## 2019-08-19 MED ORDER — MIRTAZAPINE 15 MG PO TABS: 15.0000 mg | ORAL_TABLET | Freq: Every day | ORAL | 5 refills | Status: DC

## 2019-08-19 NOTE — Assessment & Plan Note (Addendum)
Did not tolerate Wellbutrin due to side effects.  He is having some insomnia as well.  Advised him to stop Wellbutrin.  Will start Remeron 15 mg daily.  Discussed potential side effects including weight gain.  They will follow up with me in a couple of weeks via MyChart.

## 2019-08-19 NOTE — Progress Notes (Signed)
   Warren White is a 86 y.o. male who presents today for an office visit.  Assessment/Plan:   Chronic Problems Addressed Today: Depressed mood Did not tolerate Wellbutrin due to side effects.  He is having some insomnia as well.  Advised him to stop Wellbutrin.  Will start Remeron 15 mg daily.  Discussed potential side effects including weight gain.  They will follow up with me in a couple of weeks via MyChart.  Thyroid cyst Check ultrasound per radiology recommendations..     Subjective:  HPI:  Patient last seen about 3 weeks ago for depressed mood.  We started Wellbutrin to help with fatigue and energy levels.  Unfortunately he did not tolerate well and had several side effects including insomnia and mild hallucinations.  He stopped taking the medication yesterday morning.  Does not think that helped with his mood either.  Since her last visit he has also had a chest CT performed.  It was incidentally found to have thyroid cyst that needs follow-up imaging.  Not currently having any neck pain.       Objective:  Physical Exam: BP (!) 142/86   Pulse 86   Temp (!) 97 F (36.1 C)   Ht 5\' 8"  (1.727 m)   Wt 212 lb 3.2 oz (96.3 kg)   SpO2 91%   BMI 32.26 kg/m   Gen: No acute distress, resting comfortably HEENT: No thyromegaly noted. Neuro: Grossly normal, moves all extremities Psych: Normal affect and thought content      Cairo Lingenfelter M. , MD 08/19/2019 2:24 PM

## 2019-08-19 NOTE — Patient Instructions (Signed)
It was very nice to see you today!  STOP Wellbutrin.  Please start the Remeron.  Please take at bedtime.  We will also get an ultrasound of your thyroid.  Please check in with me in a couple of weeks via MyChart or give me a call.  Take care, Dr Jimmey Ralph  Please try these tips to maintain a healthy lifestyle:   Eat at least 3 REAL meals and 1-2 snacks per day.  Aim for no more than 5 hours between eating.  If you eat breakfast, please do so within one hour of getting up.    Each meal should contain half fruits/vegetables, one quarter protein, and one quarter carbs (no bigger than a computer mouse)   Cut down on sweet beverages. This includes juice, soda, and sweet tea.     Drink at least 1 glass of water with each meal and aim for at least 8 glasses per day   Exercise at least 150 minutes every week.

## 2019-08-19 NOTE — Assessment & Plan Note (Signed)
Check ultrasound per radiology recommendations.Marland Kitchen

## 2019-08-28 ENCOUNTER — Ambulatory Visit: Payer: Medicare Other | Attending: Internal Medicine

## 2019-08-28 DIAGNOSIS — Z23 Encounter for immunization: Secondary | ICD-10-CM | POA: Insufficient documentation

## 2019-08-28 NOTE — Progress Notes (Signed)
   Covid-19 Vaccination Clinic  Name:  Cayton Cuevas    MRN: [id number] DOB: May 10, 1926  08/28/2019  Mr. Klausing was observed post Covid-19 immunization for 15 minutes without incident. He was provided with Vaccine Information Sheet and instruction to access the V-Safe system.   Mr. Hopkin was instructed to call 911 with any severe reactions post vaccine: Marland Kitchen Difficulty breathing  . Swelling of face and throat  . A fast heartbeat  . A bad rash all over body  . Dizziness and weakness   Immunizations Administered    Name Date Dose VIS Date Route   Pfizer COVID-19 Vaccine 08/28/2019  9:45 AM 0.3 mL 06/07/2019 Intramuscular   Manufacturer: [employer], [employer]   Lot: [id number]   NDC: [address]-7

## 2019-08-30 ENCOUNTER — Ambulatory Visit
Admission: RE | Admit: 2019-08-30 | Discharge: 2019-08-30 | Disposition: A | Discharge status: Home / Self Care | Payer: Medicare Other | Source: Ambulatory Visit | Attending: Family Medicine | Admitting: Family Medicine

## 2019-08-30 DIAGNOSIS — E041 Nontoxic single thyroid nodule: Secondary | ICD-10-CM | POA: Diagnosis not present

## 2019-09-02 NOTE — Progress Notes (Signed)
Please inform patient of the following:  Nodules are stable.  Radiology recommended referral for biopsy or repeating ultrasound in 1 year.  Would be okay with either option-patient can let us know which he prefers.

## 2019-09-12 ENCOUNTER — Emergency Department (HOSPITAL_COMMUNITY): Payer: Medicare Other

## 2019-09-12 ENCOUNTER — Telehealth: Payer: Self-pay | Admitting: Family Medicine

## 2019-09-12 ENCOUNTER — Encounter (HOSPITAL_COMMUNITY): Payer: Self-pay

## 2019-09-12 ENCOUNTER — Inpatient Hospital Stay (HOSPITAL_COMMUNITY)
Admission: EM | Admit: 2019-09-12 | Discharge: 2019-09-26 | DRG: 291 | Disposition: E | Payer: Medicare Other | Attending: Internal Medicine | Admitting: Internal Medicine

## 2019-09-12 ENCOUNTER — Other Ambulatory Visit: Payer: Self-pay

## 2019-09-12 DIAGNOSIS — N401 Enlarged prostate with lower urinary tract symptoms: Secondary | ICD-10-CM | POA: Diagnosis not present

## 2019-09-12 DIAGNOSIS — G2581 Restless legs syndrome: Secondary | ICD-10-CM | POA: Diagnosis present

## 2019-09-12 DIAGNOSIS — F329 Major depressive disorder, single episode, unspecified: Secondary | ICD-10-CM | POA: Diagnosis present

## 2019-09-12 DIAGNOSIS — S0990XA Unspecified injury of head, initial encounter: Secondary | ICD-10-CM | POA: Diagnosis not present

## 2019-09-12 DIAGNOSIS — J9622 Acute and chronic respiratory failure with hypercapnia: Secondary | ICD-10-CM | POA: Diagnosis not present

## 2019-09-12 DIAGNOSIS — R9431 Abnormal electrocardiogram [ECG] [EKG]: Secondary | ICD-10-CM | POA: Diagnosis present

## 2019-09-12 DIAGNOSIS — J9621 Acute and chronic respiratory failure with hypoxia: Secondary | ICD-10-CM | POA: Diagnosis not present

## 2019-09-12 DIAGNOSIS — I5033 Acute on chronic diastolic (congestive) heart failure: Secondary | ICD-10-CM | POA: Diagnosis present

## 2019-09-12 DIAGNOSIS — I5030 Unspecified diastolic (congestive) heart failure: Secondary | ICD-10-CM | POA: Diagnosis not present

## 2019-09-12 DIAGNOSIS — Z7189 Other specified counseling: Secondary | ICD-10-CM | POA: Diagnosis not present

## 2019-09-12 DIAGNOSIS — Z515 Encounter for palliative care: Secondary | ICD-10-CM | POA: Diagnosis not present

## 2019-09-12 DIAGNOSIS — T501X6A Underdosing of loop [high-ceiling] diuretics, initial encounter: Secondary | ICD-10-CM | POA: Diagnosis present

## 2019-09-12 DIAGNOSIS — R0902 Hypoxemia: Secondary | ICD-10-CM

## 2019-09-12 DIAGNOSIS — I361 Nonrheumatic tricuspid (valve) insufficiency: Secondary | ICD-10-CM | POA: Diagnosis not present

## 2019-09-12 DIAGNOSIS — H8110 Benign paroxysmal vertigo, unspecified ear: Secondary | ICD-10-CM

## 2019-09-12 DIAGNOSIS — J449 Chronic obstructive pulmonary disease, unspecified: Secondary | ICD-10-CM | POA: Diagnosis present

## 2019-09-12 DIAGNOSIS — R601 Generalized edema: Secondary | ICD-10-CM | POA: Diagnosis present

## 2019-09-12 DIAGNOSIS — R404 Transient alteration of awareness: Secondary | ICD-10-CM | POA: Diagnosis not present

## 2019-09-12 DIAGNOSIS — I110 Hypertensive heart disease with heart failure: Principal | ICD-10-CM | POA: Diagnosis present

## 2019-09-12 DIAGNOSIS — S2239XA Fracture of one rib, unspecified side, initial encounter for closed fracture: Secondary | ICD-10-CM

## 2019-09-12 DIAGNOSIS — N400 Enlarged prostate without lower urinary tract symptoms: Secondary | ICD-10-CM | POA: Diagnosis present

## 2019-09-12 DIAGNOSIS — Z66 Do not resuscitate: Secondary | ICD-10-CM | POA: Diagnosis present

## 2019-09-12 DIAGNOSIS — F10239 Alcohol dependence with withdrawal, unspecified: Secondary | ICD-10-CM | POA: Diagnosis not present

## 2019-09-12 DIAGNOSIS — L409 Psoriasis, unspecified: Secondary | ICD-10-CM | POA: Diagnosis present

## 2019-09-12 DIAGNOSIS — K219 Gastro-esophageal reflux disease without esophagitis: Secondary | ICD-10-CM | POA: Diagnosis not present

## 2019-09-12 DIAGNOSIS — Z79899 Other long term (current) drug therapy: Secondary | ICD-10-CM

## 2019-09-12 DIAGNOSIS — S2232XA Fracture of one rib, left side, initial encounter for closed fracture: Secondary | ICD-10-CM | POA: Diagnosis not present

## 2019-09-12 DIAGNOSIS — R0600 Dyspnea, unspecified: Secondary | ICD-10-CM

## 2019-09-12 DIAGNOSIS — F17290 Nicotine dependence, other tobacco product, uncomplicated: Secondary | ICD-10-CM | POA: Diagnosis present

## 2019-09-12 DIAGNOSIS — R918 Other nonspecific abnormal finding of lung field: Secondary | ICD-10-CM | POA: Diagnosis not present

## 2019-09-12 DIAGNOSIS — R296 Repeated falls: Secondary | ICD-10-CM | POA: Diagnosis present

## 2019-09-12 DIAGNOSIS — J439 Emphysema, unspecified: Secondary | ICD-10-CM | POA: Diagnosis present

## 2019-09-12 DIAGNOSIS — Z888 Allergy status to other drugs, medicaments and biological substances status: Secondary | ICD-10-CM

## 2019-09-12 DIAGNOSIS — R410 Disorientation, unspecified: Secondary | ICD-10-CM | POA: Diagnosis present

## 2019-09-12 DIAGNOSIS — I739 Peripheral vascular disease, unspecified: Secondary | ICD-10-CM | POA: Diagnosis not present

## 2019-09-12 DIAGNOSIS — Z91128 Patient's intentional underdosing of medication regimen for other reason: Secondary | ICD-10-CM

## 2019-09-12 DIAGNOSIS — Y92009 Unspecified place in unspecified non-institutional (private) residence as the place of occurrence of the external cause: Secondary | ICD-10-CM | POA: Diagnosis not present

## 2019-09-12 DIAGNOSIS — S2231XA Fracture of one rib, right side, initial encounter for closed fracture: Secondary | ICD-10-CM | POA: Diagnosis present

## 2019-09-12 DIAGNOSIS — T447X6A Underdosing of beta-adrenoreceptor antagonists, initial encounter: Secondary | ICD-10-CM | POA: Diagnosis present

## 2019-09-12 DIAGNOSIS — G92 Toxic encephalopathy: Secondary | ICD-10-CM | POA: Diagnosis present

## 2019-09-12 DIAGNOSIS — I509 Heart failure, unspecified: Secondary | ICD-10-CM | POA: Diagnosis not present

## 2019-09-12 DIAGNOSIS — F1010 Alcohol abuse, uncomplicated: Secondary | ICD-10-CM | POA: Diagnosis not present

## 2019-09-12 DIAGNOSIS — Z20822 Contact with and (suspected) exposure to covid-19: Secondary | ICD-10-CM | POA: Diagnosis not present

## 2019-09-12 DIAGNOSIS — W19XXXA Unspecified fall, initial encounter: Secondary | ICD-10-CM | POA: Diagnosis not present

## 2019-09-12 DIAGNOSIS — I5031 Acute diastolic (congestive) heart failure: Secondary | ICD-10-CM | POA: Diagnosis not present

## 2019-09-12 DIAGNOSIS — Z7951 Long term (current) use of inhaled steroids: Secondary | ICD-10-CM

## 2019-09-12 DIAGNOSIS — J9811 Atelectasis: Secondary | ICD-10-CM | POA: Diagnosis not present

## 2019-09-12 DIAGNOSIS — Z9114 Patient's other noncompliance with medication regimen: Secondary | ICD-10-CM

## 2019-09-12 DIAGNOSIS — R531 Weakness: Secondary | ICD-10-CM | POA: Diagnosis not present

## 2019-09-12 DIAGNOSIS — R0602 Shortness of breath: Secondary | ICD-10-CM | POA: Diagnosis not present

## 2019-09-12 HISTORY — DX: Depression, unspecified: F32.A

## 2019-09-12 LAB — CBC WITH DIFFERENTIAL/PLATELET
Abs Immature Granulocytes: 0.1 10*3/uL — ABNORMAL HIGH (ref 0.00–0.07)
Basophils Absolute: 0.1 10*3/uL (ref 0.0–0.1)
Basophils Relative: 1 %
Eosinophils Absolute: 0.2 10*3/uL (ref 0.0–0.5)
Eosinophils Relative: 3 %
HCT: 51.2 % (ref 39.0–52.0)
Hemoglobin: 15.7 g/dL (ref 13.0–17.0)
Immature Granulocytes: 1 %
Lymphocytes Relative: 14 %
Lymphs Abs: 1.1 10*3/uL (ref 0.7–4.0)
MCH: 27.8 pg (ref 26.0–34.0)
MCHC: 30.7 g/dL (ref 30.0–36.0)
MCV: 90.8 fL (ref 80.0–100.0)
Monocytes Absolute: 0.8 10*3/uL (ref 0.1–1.0)
Monocytes Relative: 10 %
Neutro Abs: 5.6 10*3/uL (ref 1.7–7.7)
Neutrophils Relative %: 71 %
Platelets: 164 10*3/uL (ref 150–400)
RBC: 5.64 MIL/uL (ref 4.22–5.81)
RDW: 19.3 % — ABNORMAL HIGH (ref 11.5–15.5)
WBC: 7.9 10*3/uL (ref 4.0–10.5)
nRBC: 0.3 % — ABNORMAL HIGH (ref 0.0–0.2)

## 2019-09-12 LAB — RESPIRATORY PANEL BY RT PCR (FLU A&B, COVID)
Influenza A by PCR: NEGATIVE
Influenza B by PCR: NEGATIVE
SARS Coronavirus 2 by RT PCR: NEGATIVE

## 2019-09-12 LAB — COMPREHENSIVE METABOLIC PANEL
ALT: 29 U/L (ref 0–44)
AST: 23 U/L (ref 15–41)
Albumin: 3.1 g/dL — ABNORMAL LOW (ref 3.5–5.0)
Alkaline Phosphatase: 80 U/L (ref 38–126)
Anion gap: 7 (ref 5–15)
BUN: 36 mg/dL — ABNORMAL HIGH (ref 8–23)
CO2: 29 mmol/L (ref 22–32)
Calcium: 8.8 mg/dL — ABNORMAL LOW (ref 8.9–10.3)
Chloride: 108 mmol/L (ref 98–111)
Creatinine, Ser: 1.14 mg/dL (ref 0.61–1.24)
GFR calc Af Amer: >60 mL/min (ref >60)
GFR calc non Af Amer: 56 mL/min — ABNORMAL LOW (ref >60)
Glucose, Bld: 112 mg/dL — ABNORMAL HIGH (ref 70–99)
Potassium: 4.3 mmol/L (ref 3.5–5.1)
Sodium: 144 mmol/L (ref 135–145)
Total Bilirubin: 0.7 mg/dL (ref 0.3–1.2)
Total Protein: 7.1 g/dL (ref 6.5–8.1)

## 2019-09-12 LAB — BRAIN NATRIURETIC PEPTIDE: B Natriuretic Peptide: 610.8 pg/mL — ABNORMAL HIGH (ref 0.0–100.0)

## 2019-09-12 MED ORDER — METOPROLOL SUCCINATE ER 25 MG PO TB24
25.0000 mg | ORAL_TABLET | Freq: Every day | ORAL | Status: DC
  Administered 2019-09-12 – 2019-09-14 (×3): 25 mg via ORAL
  Filled 2019-09-12 (×3): qty 1

## 2019-09-12 MED ORDER — IPRATROPIUM-ALBUTEROL 0.5-2.5 (3) MG/3ML IN SOLN
3.0000 mL | Freq: Four times a day (QID) | RESPIRATORY_TRACT | Status: DC
  Administered 2019-09-12: 16:00:00 3 mL via RESPIRATORY_TRACT
  Filled 2019-09-12: qty 3

## 2019-09-12 MED ORDER — FUROSEMIDE 10 MG/ML IJ SOLN
40.0000 mg | Freq: Once | INTRAMUSCULAR | Status: AC
  Administered 2019-09-12: 12:00:00 40 mg via INTRAVENOUS
  Filled 2019-09-12: qty 4

## 2019-09-12 MED ORDER — FUROSEMIDE 10 MG/ML IJ SOLN
40.0000 mg | Freq: Two times a day (BID) | INTRAMUSCULAR | Status: DC
  Administered 2019-09-12 – 2019-09-14 (×5): 40 mg via INTRAVENOUS
  Filled 2019-09-12 (×5): qty 4

## 2019-09-12 MED ORDER — ONDANSETRON HCL 4 MG/2ML IJ SOLN: 4.0000 mg | Freq: Four times a day (QID) | INTRAMUSCULAR | Status: DC | PRN

## 2019-09-12 MED ORDER — MIRTAZAPINE 15 MG PO TABS
15.0000 mg | ORAL_TABLET | Freq: Every day | ORAL | Status: DC
  Administered 2019-09-12 – 2019-09-14 (×3): 15 mg via ORAL
  Filled 2019-09-12 (×3): qty 1

## 2019-09-12 MED ORDER — TRAZODONE HCL 100 MG PO TABS
200.0000 mg | ORAL_TABLET | Freq: Every day | ORAL | Status: DC
  Administered 2019-09-12 – 2019-09-14 (×3): 200 mg via ORAL
  Filled 2019-09-12 (×3): qty 2

## 2019-09-12 MED ORDER — PRAMIPEXOLE DIHYDROCHLORIDE 0.25 MG PO TABS
0.2500 mg | ORAL_TABLET | Freq: Two times a day (BID) | ORAL | Status: DC
  Administered 2019-09-12: 17:00:00 0.25 mg via ORAL
  Filled 2019-09-12: qty 1

## 2019-09-12 MED ORDER — PANTOPRAZOLE SODIUM 40 MG PO TBEC
40.0000 mg | DELAYED_RELEASE_TABLET | Freq: Every day | ORAL | Status: DC
  Administered 2019-09-13 – 2019-09-14 (×2): 40 mg via ORAL
  Filled 2019-09-12 (×2): qty 1

## 2019-09-12 MED ORDER — DM-GUAIFENESIN ER 30-600 MG PO TB12
1.0000 | ORAL_TABLET | Freq: Two times a day (BID) | ORAL | Status: DC
  Administered 2019-09-12 – 2019-09-13 (×2): 1 via ORAL
  Filled 2019-09-12 (×2): qty 1

## 2019-09-12 MED ORDER — IPRATROPIUM-ALBUTEROL 0.5-2.5 (3) MG/3ML IN SOLN
3.0000 mL | Freq: Three times a day (TID) | RESPIRATORY_TRACT | Status: DC
  Administered 2019-09-12 – 2019-09-15 (×8): 3 mL via RESPIRATORY_TRACT
  Filled 2019-09-12 (×8): qty 3

## 2019-09-12 MED ORDER — ACETAMINOPHEN 325 MG PO TABS: 650.0000 mg | ORAL_TABLET | Freq: Four times a day (QID) | ORAL | Status: DC | PRN

## 2019-09-12 MED ORDER — ACETAMINOPHEN 650 MG RE SUPP: 650.0000 mg | Freq: Four times a day (QID) | RECTAL | Status: DC | PRN

## 2019-09-12 MED ORDER — ENOXAPARIN SODIUM 40 MG/0.4ML ~~LOC~~ SOLN
40.0000 mg | SUBCUTANEOUS | Status: DC
  Administered 2019-09-12 – 2019-09-14 (×3): 40 mg via SUBCUTANEOUS
  Filled 2019-09-12 (×3): qty 0.4

## 2019-09-12 MED ORDER — PRAMIPEXOLE DIHYDROCHLORIDE 0.25 MG PO TABS
0.2500 mg | ORAL_TABLET | Freq: Two times a day (BID) | ORAL | Status: DC
  Administered 2019-09-12 – 2019-09-14 (×4): 0.25 mg via ORAL
  Filled 2019-09-12 (×5): qty 1

## 2019-09-12 MED ORDER — BUPROPION HCL ER (XL) 150 MG PO TB24: 150.0000 mg | ORAL_TABLET | Freq: Every day | ORAL | Status: DC

## 2019-09-12 MED ORDER — ONDANSETRON HCL 4 MG PO TABS: 4.0000 mg | ORAL_TABLET | Freq: Four times a day (QID) | ORAL | Status: DC | PRN

## 2019-09-12 NOTE — ED Provider Notes (Signed)
[facility]   CSN: [id number] Arrival date & time: 09-17-2019  1121     History Chief Complaint  Patient presents with  . Fall  . Shortness of Breath    Warren White is a 86 y.o. male.  Patient complains of weakness and falls with severe swelling to his leg.  Patient has history of congestive heart failure and his daughter states he probably has not taken his Lasix  The history is provided by the patient. No language interpreter was used.  Fall This is a new problem. The current episode started more than 2 days ago. The problem occurs constantly. The problem has been resolved. Pertinent negatives include no chest pain, no abdominal pain and no headaches. Nothing aggravates the symptoms. Nothing relieves the symptoms. He has tried nothing for the symptoms.       Past Medical History:  Diagnosis Date  . COPD (chronic obstructive pulmonary disease) (HCC)   . Depression   . Emphysema of lung (HCC)   . GERD (gastroesophageal reflux disease)   . Hypertensive heart disease with chronic diastolic congestive heart failure (HCC) 03/2017   However no edema noted.  EF had improved from 40 and 45% to 50-55% by echo in January 2019.  Moderate LVH with diastolic dysfunction noted.  . Psoriasis   . Restless leg     Patient Active Problem List   Diagnosis Date Noted  . Thyroid cyst 08/19/2019  . Depressed mood 07/25/2019  . Venous stasis dermatitis without varicosities 01/04/2019  . Peripheral artery disease (HCC) 04/16/2018  . Chronic diastolic CHF (congestive heart failure) (HCC) 02/04/2017  . Encounter for palliative care   . COPD (chronic obstructive pulmonary disease) (HCC) 02/02/2017  . BPV (benign positional vertigo) 02/02/2017  . RLS (restless legs syndrome) 02/02/2017  . GERD (gastroesophageal reflux disease) 02/02/2017  . Psoriasis 02/02/2017  . BPH (benign prostatic hyperplasia) 02/02/2017    Past Surgical History:    Procedure Laterality Date  . BACK SURGERY    . CHOLECYSTECTOMY    . ROTATOR CUFF REPAIR Bilateral   . TOTAL HIP ARTHROPLASTY Left   . TRANSTHORACIC ECHOCARDIOGRAM  03/2017   Insetting of what looked like pneumonia/COPD exacerbation: (Dr. Sharyn Lull): EF estimated 40 and 44% with global hypokinesis.  Impaired relaxation and mild concentric LVH.  Normal RV size.  Mild LA dilation.  . TRANSTHORACIC ECHOCARDIOGRAM  06/2017   Dr. Donnie Aho: EF roughly 55%.  Moderate LVH.  No details of diastolic function noted, likely diastolic dysfunction grade 1-2.       Family History  Problem Relation Age of Onset  . Restless legs syndrome Mother   . Cataracts Mother     Social History   Tobacco Use  . Smoking status: Current Every Day Smoker    Types: Pipe  . Smokeless tobacco: Never Used  Substance Use Topics  . Alcohol use: Yes    Alcohol/week: 14.0 standard drinks    Types: 14 Standard drinks or equivalent per week    Comment: occ  . Drug use: No    Home Medications Prior to Admission medications   Medication Sig Start Date End Date Taking? Authorizing Provider  albuterol (PROVENTIL HFA;VENTOLIN HFA) 108 (90 Base) MCG/ACT inhaler Inhale 2 puffs into the lungs every 6 (six) hours as needed for wheezing or shortness of breath. Patient not taking: Reported on 08/19/2019 02/07/17   Tyrone Nine, MD  buPROPion (WELLBUTRIN XL) 150 MG 24 hr tablet Take 1 tablet (150 mg total)  by mouth daily. 07/25/19   Ardith Dark, MD  dextromethorphan-guaiFENesin [facility] DM) 30-600 MG 12hr tablet Take 1 tablet by mouth 2 (two) times daily.    [provider]  Fluticasone-Umeclidin-Vilant (TRELEGY ELLIPTA) 100-62.5-25 MCG/INH AEPB Inhale 1 puff into the lungs daily. 05/31/19   Chilton Greathouse, MD  furosemide (LASIX) 40 MG tablet TAKE 40 MG  Tablet Monday , Wednesday,Fridays - and  If weight gain of 3 lbs, or shortness of breathe , or swelling as needed 01/02/19   Marykay Lex, MD  ipratropium-albuterol  (DUONEB) 0.5-2.5 (3) MG/3ML SOLN USE 1 VIAL IN NEBULIZER 3 TIMES DAILY. Generic: DUONEB 10/08/18   Mannam, Colbert Coyer, MD  metoprolol succinate (TOPROL-XL) 25 MG 24 hr tablet Take 1 tablet (25 mg total) by mouth daily. 01/02/19   Marykay Lex, MD  mirtazapine (REMERON) 15 MG tablet Take 1 tablet (15 mg total) by mouth at bedtime. 08/19/19   Ardith Dark, MD  pantoprazole (PROTONIX) 40 MG tablet TAKE 1 TABLET ONCE DAILY IN THE EVENING. 07/26/19   Ardith Dark, MD  pramipexole (MIRAPEX) 0.5 MG tablet TAKE 1/2 TABLET TWICE DAILY. MAY TAKE AN ADDITIONAL 1/2 TABLET IF NEEDED. 07/23/19   Ardith Dark, MD  traZODone (DESYREL) 100 MG tablet TAKE 2 TABLETS AT BEDTIME. 06/24/19   Ardith Dark, MD    Allergies    Wellbutrin [bupropion]  Review of Systems   Review of Systems  Constitutional: Positive for fatigue. Negative for appetite change.  HENT: Negative for congestion, ear discharge and sinus pressure.   Eyes: Negative for discharge.  Respiratory: Negative for cough.   Cardiovascular: Negative for chest pain.  Gastrointestinal: Negative for abdominal pain and diarrhea.  Genitourinary: Negative for frequency and hematuria.  Musculoskeletal: Negative for back pain.  Skin: Negative for rash.  Neurological: Negative for seizures and headaches.  Psychiatric/Behavioral: Negative for hallucinations.    Physical Exam Updated Vital Signs BP (!) 141/86   Pulse 72   Temp 97.7 F (36.5 C) (Oral)   Resp 19   Ht 5\' 8"  (1.727 m)   Wt 96.6 kg   SpO2 96%   BMI 32.39 kg/m   Physical Exam Vitals and nursing note reviewed.  Constitutional:      Appearance: He is well-developed.  HENT:     Head: Normocephalic.     Nose: Nose normal.  Eyes:     General: No scleral icterus.    Conjunctiva/sclera: Conjunctivae normal.  Neck:     Thyroid: No thyromegaly.  Cardiovascular:     Rate and Rhythm: Normal rate and regular rhythm.     Heart sounds: No murmur. No friction rub. No gallop.     Pulmonary:     Breath sounds: No stridor. No wheezing or rales.  Chest:     Chest wall: No tenderness.  Abdominal:     General: There is no distension.     Tenderness: There is no abdominal tenderness. There is no rebound.  Musculoskeletal:        General: Normal range of motion.     Cervical back: Neck supple.     Comments: Patient has 3+ edema bilaterally all the way up to his waist.  Lymphadenopathy:     Cervical: No cervical adenopathy.  Skin:    Findings: No erythema or rash.  Neurological:     Mental Status: He is alert and oriented to person, place, and time.     Motor: No abnormal muscle tone.     Coordination:  Coordination normal.  Psychiatric:        Behavior: Behavior normal.     ED Results / Procedures / Treatments   Labs (all labs ordered are listed, but only abnormal results are displayed) Labs Reviewed  CBC WITH DIFFERENTIAL/PLATELET - Abnormal; Notable for the following components:      Result Value   RDW 19.3 (*)    nRBC 0.3 (*)    Abs Immature Granulocytes 0.10 (*)    All other components within normal limits  COMPREHENSIVE METABOLIC PANEL - Abnormal; Notable for the following components:   Glucose, Bld 112 (*)    BUN 36 (*)    Calcium 8.8 (*)    Albumin 3.1 (*)    GFR calc non Af Amer 56 (*)    All other components within normal limits  BRAIN NATRIURETIC PEPTIDE - Abnormal; Notable for the following components:   B Natriuretic Peptide 610.8 (*)    All other components within normal limits  RESPIRATORY PANEL BY RT PCR (FLU A&B, COVID)    EKG None  Radiology CT Head Wo Contrast  Result Date: 2019-10-12 CLINICAL DATA:  Fall EXAM: CT HEAD WITHOUT CONTRAST TECHNIQUE: Contiguous axial images were obtained from the base of the skull through the vertex without intravenous contrast. COMPARISON:  None. FINDINGS: Brain: No evidence of acute infarction, hemorrhage, hydrocephalus, extra-axial collection or mass lesion/mass effect. Mild periventricular  white matter hypodensity. Vascular: No hyperdense vessel or unexpected calcification. Skull: Normal. Negative for fracture or focal lesion. Sinuses/Orbits: No acute finding. Other: None. IMPRESSION: No acute intracranial pathology. Mild small-vessel white matter disease. Electronically Signed   By: Eddie Candle M.D.   On: 10/12/19 14:04   DG Chest Port 1 View  Result Date: 12-Oct-2019 CLINICAL DATA:  Shortness of breath. Tripped on curb leading to fall. Right rib pain. EXAM: PORTABLE CHEST 1 VIEW COMPARISON:  Radiograph 05/31/2019, CT 07/24/2019 FINDINGS: Cardiomegaly with unchanged mediastinal contours. Vascular congestion with interstitial and bronchial thickening. No pneumothorax or confluent airspace disease. No large pleural effusion. Probable minimally displaced right lateral seventh rib fracture. IMPRESSION: 1. Probable minimally displaced right lateral seventh rib fracture. No pneumothorax. 2. Chronic cardiomegaly. Vascular congestion with suggestion of mild pulmonary edema. Electronically Signed   By: Keith Rake M.D.   On: 10-12-19 13:39    Procedures Procedures (including critical care time)  Medications Ordered in ED Medications  furosemide (LASIX) injection 40 mg (40 mg Intravenous Given 2019/10/12 1229)    ED Course  I have reviewed the triage vital signs and the nursing notes.  Pertinent labs & imaging results that were available during my care of the patient were reviewed by me and considered in my medical decision making (see chart for details).    MDM Rules/Calculators/A&P                      Patient with anasarca and falling 3 times and 1 broken rib.  I have consulted cardiology for the heart failure and they will see the patient tomorrow.  Medicine will admit Final Clinical Impression(s) / ED Diagnoses Final diagnoses:  Fall, initial encounter  Diastolic congestive heart failure, unspecified HF chronicity ([place])    Rx / DC Orders ED Discharge Orders    None        Milton Ferguson, MD 10-12-19 1446

## 2019-09-12 NOTE — ED Triage Notes (Addendum)
Patient states he had a fall in which he tripped on the curb. Patient c/o right rib cage area pain and SOB worse. Patient states he feels like his legs won't support him anymore.  Patient states he wears prn O2 2.5 L/min at home.   Noted that the patient's pants were wet on the lower leg and the patient's daughter states that his legs have been weeping.

## 2019-09-12 NOTE — Telephone Encounter (Signed)
Noted. Agree with plan.  Katina Degree. Jimmey Ralph, MD 09/27/2019 9:38 AM

## 2019-09-12 NOTE — Telephone Encounter (Signed)
Called daughter, reviewed questions. Patient has had increased confusion, frequent falls, changes in speech. Daughter wanted to know if he could be seen by DR. Jimmey Ralph today and admitted to hospital from our office. Informed we are not able to do that here. She should take to ED. She agreed and will call if any other questions. Per her request I have cancelled appointment for tomorrow.

## 2019-09-12 NOTE — H&P (Addendum)
History and Physical    Warren White  TGG:[id number]  DOB: January 25, 1926  DOA: 08/31/2019 PCP: Ardith Dark, MD   Patient coming from: home with wife  Chief Complaint: multiple falls, dizziness, swelling  HPI: Warren White is a 86 y.o. male with medical history of COPD, chronic diastolic CHF, HTN, psoriasis, RLS, GERD is brought in by his daughter. He has had a fall every day for the past 4 days. The patient states he has been having episodes of dizziness with movements of his head. He had "crystals in his ears" in the past and had them treated. He also feels very weak and feels that his legs are giving out leading to some of his falls. He has significant swelling in his legs which are weeping as well. His daughter states that he does not like to take his Lasix as it makes him urinate too much. He also states he has not been taking his Metoprolol regularly but he takes other medications as ordered.   ED Course: resp rate in 20s, pulse ox in low 90s at rest- he is too weak to walk BNP 610 CXR suggestive of a 7th rib fracture and mild pulmonary edema  Review of Systems:  He admits to severe chest congestion/phlegm- he continues to smoke All other systems reviewed and apart from HPI, are negative.  Past Medical History:  Diagnosis Date  . COPD (chronic obstructive pulmonary disease) (HCC)   . Depression   . Emphysema of lung (HCC)   . GERD (gastroesophageal reflux disease)   . Hypertensive heart disease with chronic diastolic congestive heart failure (HCC) 03/2017   However no edema noted.  EF had improved from 40 and 45% to 50-55% by echo in January 2019.  Moderate LVH with diastolic dysfunction noted.  . Psoriasis   . Restless leg     Past Surgical History:  Procedure Laterality Date  . BACK SURGERY    . CHOLECYSTECTOMY    . ROTATOR CUFF REPAIR Bilateral   . TOTAL HIP ARTHROPLASTY Left   . TRANSTHORACIC ECHOCARDIOGRAM  03/2017   Insetting of what looked like pneumonia/COPD  exacerbation: (Dr. Sharyn Lull): EF estimated 40 and 44% with global hypokinesis.  Impaired relaxation and mild concentric LVH.  Normal RV size.  Mild LA dilation.  . TRANSTHORACIC ECHOCARDIOGRAM  06/2017   Dr. Donnie Aho: EF roughly 55%.  Moderate LVH.  No details of diastolic function noted, likely diastolic dysfunction grade 1-2.    Social History:   reports that he has been smoking pipe. He has never used smokeless tobacco. He reports current alcohol use of about 14.0 standard drinks of alcohol per week. He reports that he does not use drugs.  Allergies  Allergen Reactions  . Wellbutrin [Bupropion]     Family History  Problem Relation Age of Onset  . Restless legs syndrome Mother   . Cataracts Mother      Prior to Admission medications   Medication Sig Start Date End Date Taking? Authorizing Provider  albuterol (PROVENTIL HFA;VENTOLIN HFA) 108 (90 Base) MCG/ACT inhaler Inhale 2 puffs into the lungs every 6 (six) hours as needed for wheezing or shortness of breath. Patient not taking: Reported on 08/19/2019 02/07/17   Tyrone Nine, MD  buPROPion (WELLBUTRIN XL) 150 MG 24 hr tablet Take 1 tablet (150 mg total) by mouth daily. 07/25/19   Ardith Dark, MD  dextromethorphan-guaiFENesin [facility] DM) 30-600 MG 12hr tablet Take 1 tablet by mouth 2 (two) times daily.    [provider]  Fluticasone-Umeclidin-Vilant (TRELEGY ELLIPTA) 100-62.5-25 MCG/INH AEPB Inhale 1 puff into the lungs daily. 05/31/19   Marshell Garfinkel, MD  furosemide (LASIX) 40 MG tablet TAKE 40 MG  Tablet Monday , Wednesday,Fridays - and  If weight gain of 3 lbs, or shortness of breathe , or swelling as needed 01/02/19   Leonie Man, MD  ipratropium-albuterol (DUONEB) 0.5-2.5 (3) MG/3ML SOLN USE 1 VIAL IN NEBULIZER 3 TIMES DAILY. Generic: DUONEB 10/08/18   Mannam, Hart Robinsons, MD  metoprolol succinate (TOPROL-XL) 25 MG 24 hr tablet Take 1 tablet (25 mg total) by mouth daily. 01/02/19   Leonie Man, MD  mirtazapine  (REMERON) 15 MG tablet Take 1 tablet (15 mg total) by mouth at bedtime. 08/19/19   Vivi Barrack, MD  pantoprazole (PROTONIX) 40 MG tablet TAKE 1 TABLET ONCE DAILY IN THE EVENING. 07/26/19   Vivi Barrack, MD  pramipexole (MIRAPEX) 0.5 MG tablet TAKE 1/2 TABLET TWICE DAILY. MAY TAKE AN ADDITIONAL 1/2 TABLET IF NEEDED. 07/23/19   Vivi Barrack, MD  traZODone (DESYREL) 100 MG tablet TAKE 2 TABLETS AT BEDTIME. 06/24/19   Vivi Barrack, MD    Physical Exam: Wt Readings from Last 3 Encounters:  09/24/2019 96.6 kg  08/19/19 96.3 kg  05/31/19 96 kg   Vitals:   September 24, 2019 1300 Sep 24, 2019 1330 24-Sep-2019 1502 09/24/2019 1600  BP: (!) 141/91 (!) 141/86 125/89   Pulse: 78 72 74   Resp: (!) 23 19 (!) 31   Temp:      TempSrc:      SpO2: 97% 96% 91% 93%  Weight:      Height:          Constitutional:  Calm & comfortable Eyes: PERRLA, lids and conjunctivae normal ENT:  Mucous membranes are moist.  Pharynx clear of exudate   Normal dentition.  Neck: Supple, no masses  Respiratory:  Severe rhonchi  Cardiovascular:  S1 & S2 heard, regular rate and rhythm Abdomen:  Distended, tympanic No tenderness, No masses Bowel sounds normal Extremities:  No clubbing / cyanosis Severe pedal edema with blebs and weeping No joint deformity    Skin:  No rashes, lesions or ulcers Neurologic:  AAO x 3 CN 2-12 grossly intact Sensation intact Strength 5/5 in all 4 extremities Psychiatric:  Normal Mood and affect    Labs on Admission: I have personally reviewed following labs and imaging studies  CBC: Recent Labs  Lab September 24, 2019 1223  WBC 7.9  NEUTROABS 5.6  HGB 15.7  HCT 51.2  MCV 90.8  PLT 867   Basic Metabolic Panel: Recent Labs  Lab 09/24/19 1223  NA 144  K 4.3  CL 108  CO2 29  GLUCOSE 112*  BUN 36*  CREATININE 1.14  CALCIUM 8.8*   GFR: Estimated Creatinine Clearance: 47.6 mL/min (by C-G formula based on SCr of 1.14 mg/dL). Liver Function Tests: Recent Labs  Lab  September 24, 2019 1223  AST 23  ALT 29  ALKPHOS 80  BILITOT 0.7  PROT 7.1  ALBUMIN 3.1*   No results for input(s): LIPASE, AMYLASE in the last 168 hours. No results for input(s): AMMONIA in the last 168 hours. Coagulation Profile: No results for input(s): INR, PROTIME in the last 168 hours. Cardiac Enzymes: No results for input(s): CKTOTAL, CKMB, CKMBINDEX, TROPONINI in the last 168 hours. BNP (last 3 results) No results for input(s): PROBNP in the last 8760 hours. HbA1C: No results for input(s): HGBA1C in the last 72 hours. CBG: No results for input(s): GLUCAP  in the last 168 hours. Lipid Profile: No results for input(s): CHOL, HDL, LDLCALC, TRIG, CHOLHDL, LDLDIRECT in the last 72 hours. Thyroid Function Tests: No results for input(s): TSH, T4TOTAL, FREET4, T3FREE, THYROIDAB in the last 72 hours. Anemia Panel: No results for input(s): VITAMINB12, FOLATE, FERRITIN, TIBC, IRON, RETICCTPCT in the last 72 hours. Urine analysis: No results found for: COLORURINE, APPEARANCEUR, LABSPEC, PHURINE, GLUCOSEU, HGBUR, BILIRUBINUR, KETONESUR, PROTEINUR, UROBILINOGEN, NITRITE, LEUKOCYTESUR Sepsis Labs: @LABRCNTIP (procalcitonin:4,lacticidven:4) ) Recent Results (from the past 240 hour(s))  Respiratory Panel by RT PCR (Flu A&B, Covid) - Nasopharyngeal Swab     Status: None   Collection Time: 09/08/2019 12:23 PM   Specimen: Nasopharyngeal Swab  Result Value Ref Range Status   SARS Coronavirus 2 by RT PCR NEGATIVE NEGATIVE Final    Comment: (NOTE) SARS-CoV-2 target nucleic acids are NOT DETECTED. The SARS-CoV-2 RNA is generally detectable in upper respiratoy specimens during the acute phase of infection. The lowest concentration of SARS-CoV-2 viral copies this assay can detect is 131 copies/mL. A negative result does not preclude SARS-Cov-2 infection and should not be used as the sole basis for treatment or other patient management decisions. A negative result may occur with  improper specimen  collection/handling, submission of specimen other than nasopharyngeal swab, presence of viral mutation(s) within the areas targeted by this assay, and inadequate number of viral copies (<131 copies/mL). A negative result must be combined with clinical observations, patient history, and epidemiological information. The expected result is Negative. Fact Sheet for Patients:  09/14/19 Fact Sheet for Healthcare Providers:  https://www.moore.com/ This test is not yet ap proved or cleared by the https://www.young.biz/ FDA and  has been authorized for detection and/or diagnosis of SARS-CoV-2 by FDA under an Emergency Use Authorization (EUA). This EUA will remain  in effect (meaning this test can be used) for the duration of the COVID-19 declaration under Section 564(b)(1) of the Act, 21 U.S.C. section 360bbb-3(b)(1), unless the authorization is terminated or revoked sooner.    Influenza A by PCR NEGATIVE NEGATIVE Final   Influenza B by PCR NEGATIVE NEGATIVE Final    Comment: (NOTE) The Xpert Xpress SARS-CoV-2/FLU/RSV assay is intended as an aid in  the diagnosis of influenza from Nasopharyngeal swab specimens and  should not be used as a sole basis for treatment. Nasal washings and  aspirates are unacceptable for Xpert Xpress SARS-CoV-2/FLU/RSV  testing. Fact Sheet for Patients: [place] Fact Sheet for Healthcare Providers: https://www.moore.com/ This test is not yet approved or cleared by the https://www.young.biz/ FDA and  has been authorized for detection and/or diagnosis of SARS-CoV-2 by  FDA under an Emergency Use Authorization (EUA). This EUA will remain  in effect (meaning this test can be used) for the duration of the  Covid-19 declaration under Section 564(b)(1) of the Act, 21  U.S.C. section 360bbb-3(b)(1), unless the authorization is  terminated or revoked. Performed at [facility], [address], [place], [place]      Radiological Exams on Admission: CT Head Wo Contrast  Result Date: 08/28/2019 CLINICAL DATA:  Fall EXAM: CT HEAD WITHOUT CONTRAST TECHNIQUE: Contiguous axial images were obtained from the base of the skull through the vertex without intravenous contrast. COMPARISON:  None. FINDINGS: Brain: No evidence of acute infarction, hemorrhage, hydrocephalus, extra-axial collection or mass lesion/mass effect. Mild periventricular white matter hypodensity. Vascular: No hyperdense vessel or unexpected calcification. Skull: Normal. Negative for fracture or focal lesion. Sinuses/Orbits: No acute finding. Other: None. IMPRESSION: No acute intracranial pathology. Mild small-vessel  white matter disease. Electronically Signed   By: Lauralyn Primes M.D.   On: 2019/10/04 14:04   DG Chest Port 1 View  Result Date: 10-04-19 CLINICAL DATA:  Shortness of breath. Tripped on curb leading to fall. Right rib pain. EXAM: PORTABLE CHEST 1 VIEW COMPARISON:  Radiograph 05/31/2019, CT 07/24/2019 FINDINGS: Cardiomegaly with unchanged mediastinal contours. Vascular congestion with interstitial and bronchial thickening. No pneumothorax or confluent airspace disease. No large pleural effusion. Probable minimally displaced right lateral seventh rib fracture. IMPRESSION: 1. Probable minimally displaced right lateral seventh rib fracture. No pneumothorax. 2. Chronic cardiomegaly. Vascular congestion with suggestion of mild pulmonary edema. Electronically Signed   By: Narda Rutherford M.D.   On: 10-04-19 13:39    EKG: Independently reviewed. Sinus rhythm, Interventricular delay, LVH, prolonged QTc at 499 msec  Assessment/Plan Principal Problem:   Acute diastolic CHF (congestive heart failure) - pulm and pedal edema with blebs on legs and drainage of fluid form legs - start Lasix 40 mg BID- encouraged to take his Lasix appropriately - last ECHO was in 2018- will order a repeat  ECHO  Active Problems:  Frequent falls  - 4 falls in past 4 days - partly due to weakness but may also be due to BPPV - vestibular eval ordered, PT ordered   Closed rib fracture - possible 7th rib fracture- currently no pain- follow     COPD (chronic obstructive pulmonary disease) with nicotine abuse - cough and congestion- cont home meds including Nebs, Mucinex, DM - advised to stop smoking his pipe    RLS (restless legs syndrome) - cont Mirapex    GERD (gastroesophageal reflux disease) - Protonix    Prolonged QT interval - follow, avoid QT prolonging meds   DVT prophylaxis: Lovneox Code Status: DNR  Family Communication: daughter  Disposition Plan: from home- will need PT eval to determine d/c disposition  Consults called: none  Admission status: inpatient    Calvert Cantor MD [facility] Pager: [email or web address] Password [email or web address] 7PM-7AM, please contact night-coverage   2019-10-04, 4:17 PM

## 2019-09-12 NOTE — Telephone Encounter (Signed)
Warren White called in this morning saying that she may want to have her father admitted to the hospital. She states that there were a series of events that have happened in their lives, which has led to [facility] to stop taking his depression medication, having hallucinations, and falling more frequent. Wanted to know if they could get an appointment sooner than tomorrow.

## 2019-09-13 ENCOUNTER — Inpatient Hospital Stay (HOSPITAL_COMMUNITY): Payer: Medicare Other

## 2019-09-13 ENCOUNTER — Encounter (HOSPITAL_COMMUNITY): Payer: Self-pay | Admitting: Internal Medicine

## 2019-09-13 ENCOUNTER — Ambulatory Visit: Payer: Medicare Other | Admitting: Family Medicine

## 2019-09-13 DIAGNOSIS — N401 Enlarged prostate with lower urinary tract symptoms: Secondary | ICD-10-CM

## 2019-09-13 DIAGNOSIS — F1010 Alcohol abuse, uncomplicated: Secondary | ICD-10-CM

## 2019-09-13 DIAGNOSIS — I361 Nonrheumatic tricuspid (valve) insufficiency: Secondary | ICD-10-CM

## 2019-09-13 LAB — BLOOD GAS, ARTERIAL
Acid-Base Excess: 8.2 mmol/L — ABNORMAL HIGH (ref 0.0–2.0)
Bicarbonate: 35.8 mmol/L — ABNORMAL HIGH (ref 20.0–28.0)
Drawn by: 25770
FIO2: 41
O2 Saturation: 87.2 %
Patient temperature: 98.6
pCO2 arterial: 63.6 mmHg — ABNORMAL HIGH (ref 32.0–48.0)
pH, Arterial: 7.37 (ref 7.350–7.450)
pO2, Arterial: 57.4 mmHg — ABNORMAL LOW (ref 83.0–108.0)

## 2019-09-13 LAB — CBC
HCT: 52.4 % — ABNORMAL HIGH (ref 39.0–52.0)
Hemoglobin: 15.8 g/dL (ref 13.0–17.0)
MCH: 27.2 pg (ref 26.0–34.0)
MCHC: 30.2 g/dL (ref 30.0–36.0)
MCV: 90.3 fL (ref 80.0–100.0)
Platelets: 171 10*3/uL (ref 150–400)
RBC: 5.8 MIL/uL (ref 4.22–5.81)
RDW: 19.2 % — ABNORMAL HIGH (ref 11.5–15.5)
WBC: 15 10*3/uL — ABNORMAL HIGH (ref 4.0–10.5)
nRBC: 0.2 % (ref 0.0–0.2)

## 2019-09-13 LAB — BASIC METABOLIC PANEL
Anion gap: 9 (ref 5–15)
BUN: 34 mg/dL — ABNORMAL HIGH (ref 8–23)
CO2: 35 mmol/L — ABNORMAL HIGH (ref 22–32)
Calcium: 8.6 mg/dL — ABNORMAL LOW (ref 8.9–10.3)
Chloride: 102 mmol/L (ref 98–111)
Creatinine, Ser: 1.23 mg/dL (ref 0.61–1.24)
GFR calc Af Amer: 59 mL/min — ABNORMAL LOW (ref >60)
GFR calc non Af Amer: 51 mL/min — ABNORMAL LOW (ref >60)
Glucose, Bld: 105 mg/dL — ABNORMAL HIGH (ref 70–99)
Potassium: 4.4 mmol/L (ref 3.5–5.1)
Sodium: 146 mmol/L — ABNORMAL HIGH (ref 135–145)

## 2019-09-13 LAB — ECHOCARDIOGRAM COMPLETE
Height: 68 in
Weight: 3393.32 oz

## 2019-09-13 MED ORDER — LORAZEPAM 2 MG/ML IJ SOLN
1.0000 mg | INTRAMUSCULAR | Status: DC | PRN
  Administered 2019-09-14 – 2019-09-15 (×2): 1 mg via INTRAVENOUS
  Filled 2019-09-13 (×2): qty 1

## 2019-09-13 MED ORDER — FUROSEMIDE 10 MG/ML IJ SOLN
20.0000 mg | Freq: Once | INTRAMUSCULAR | Status: AC
  Administered 2019-09-13: 20 mg via INTRAVENOUS
  Filled 2019-09-13: qty 2

## 2019-09-13 MED ORDER — CHLORHEXIDINE GLUCONATE CLOTH 2 % EX PADS
6.0000 | MEDICATED_PAD | Freq: Every day | CUTANEOUS | Status: DC
  Administered 2019-09-13 – 2019-09-14 (×2): 6 via TOPICAL

## 2019-09-13 NOTE — Progress Notes (Signed)
   09/13/19 1219  MEWS Score  Temp 97.8 F (36.6 C)  BP (!) 95/57  Pulse Rate 81  Resp (!) 28  SpO2 (!) 88 %  O2 Device Nasal Cannula  O2 Flow Rate (L/min) 4 L/min  MEWS Score  MEWS Temp 0  MEWS Systolic 1  MEWS Pulse 0  MEWS RR 2  MEWS LOC 0  MEWS Score 3  MEWS Score Color Yellow  MEWS Assessment  Is this an acute change? Yes  MEWS guidelines implemented *See Row Information* Yellow  Rapid Response Notification  Name of Rapid Response RN Notified Ephriam Knuckles, RN  Date Rapid Response Notified 09/13/19  Time Rapid Response Notified 1146  Provider Notification  Provider Name/Title Dr. Tyson Babinski  Date Provider Notified 09/13/19  Time Provider Notified 1145  Notification Type Page  Notification Reason Change in status  Response See new orders  Date of Provider Response 09/13/19  Time of Provider Response 1200 (came to bedside)

## 2019-09-13 NOTE — Progress Notes (Signed)
Notified Lab that ABG being sent for analysis. 

## 2019-09-13 NOTE — Progress Notes (Signed)
Echocardiogram 2D Echocardiogram has been performed.  Warren Lacy Cory Kitt 09/13/2019, 11:37 AM

## 2019-09-13 NOTE — Progress Notes (Addendum)
PROGRESS NOTE  Warren White WUJ:[id number] DOB: Oct 02, 1925 DOA: October 10, 2019 PCP: Vivi Barrack, MD   LOS: 1 day   Brief narrative: As per HPI,  Warren White is a 86 y.o. male with medical history of COPD, chronic diastolic CHF, HTN, psoriasis, RLS, GERD was brought into the hospital by his daughter for history of recurrent falls for the last 3 to 4 days with complaints of dizziness.  Patient was supposed to be on Lasix at home but has not been taking it since it makes him urinate quite a bit.  He has not been taking his metoprolol regularly as well.  Patient was then admitted to hospital for recurrent falls.  Assessment/Plan:  Principal Problem:   Acute diastolic CHF (congestive heart failure) (HCC) Active Problems:   COPD (chronic obstructive pulmonary disease) (HCC)   BPV (benign positional vertigo)   RLS (restless legs syndrome)   GERD (gastroesophageal reflux disease)   BPH (benign prostatic hyperplasia)   Peripheral artery disease (HCC)   Falls   Closed rib fracture   Prolonged QT interval  Acute diastolic congestive heart failure.  Patient is currently on IV Lasix twice a day for pulmonary vascular congestion.  Chest x-ray showed displaced right lateral seventh rib fracture and vascular congestion with mild pulmonary edema.  Continue strict intake and output charting, daily weights.  Continue supplemental oxygen.  Check 2D echocardiogram.  Patient did have a respiratory distress this morning.  Will check repeat chest x-ray, ABG.  Keep n.p.o. for now..  Patient does have extensive upper airway noise.  Will get speech and swallow evaluation as well.  Confusion, disorientation likely secondary to mild alcohol withdrawal, respiratory distress.  Will get ABG.  We will put patient on as needed Ativan for possible withdrawal.  Keep n.p.o. for now.  Frequent falls.  With history of dizziness.  Will need vestibular evaluation.  PT evaluation.   Closed right seventh rib  fracture Continue incentive spirometry.  Focus on analgesia.     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  Continue oxygen nebulizer and supportive care.  Patient currently smokes.  Consider nicotine patch.     RLS (restless legs syndrome) - cont Mirapex    GERD (gastroesophageal reflux disease) - Protonix    Prolonged QT interval - follow, avoid QT prolonging meds as possible.  Alcohol use disorder.  Possibility of mild withdrawal at this time.  We will put the patient on as needed Ativan.  VTE Prophylaxis: Lovenox subcu  Code Status: DNR  Family Communication: I had a prolonged discussion with the patient's daughter on the phone and updated her about the clinical condition of the patient.  I have discussed about potential deterioration of his condition and potential poor prognosis.  Discussed about palliative care.  We will get palliative care consultation. I also spoke with the patient's wife and updated her about the clinical condition of the patient.  Spoke about potential palliative care/hospice if his condition were to decline and she has agreed to it.  Disposition Plan:  . Patient is from home . Likely disposition to skilled nursing facility . Barriers to discharge: Worsening respiratory status, confusion, disorientation, decompensated heart failure,  Consultants:   Palliative care  Procedures:  None  Antibiotics:  . None  Anti-infectives (From admission, onward)   None     Subjective:  Today, patient was seen and examined at bedside.  Attended rapid response.  Patient in respiratory distress.  As per the nursing staff,patient was  prurplish and had mild respiratory distress with Noisy breathing.  Patient stated that he wanted to go home and did not know why he was here, confused and disoriented, asking for beer.  Objective: Vitals:   09/13/19 1159 09/13/19 1219  BP:  (!) 95/57  Pulse:  81  Resp:  (!) 28   Temp:  97.8 F (36.6 C)  SpO2: (!) 88% (!) 88%    Intake/Output Summary (Last 24 hours) at 09/13/2019 1225 Last data filed at 09/13/2019 0942 Gross per 24 hour  Intake 120 ml  Output 2400 ml  Net -2280 ml   Filed Weights   09/17/2019 1143 09/13/19 0453  Weight: 96.6 kg 96.2 kg   Body mass index is 32.25 kg/m.   Physical Exam: GENERAL: Patient is in moderate respiratory distress, confused disoriented with noisy breathing.  On facemask oxygen HENT: No scleral pallor or icterus. Pupils equally reactive to light. Oral mucosa is moist NECK: is supple, no gross swelling noted. CHEST: Respirations bilaterally.  Coarse breath sounds with rhonchi.   CVS: S1 and S2 heard, no murmur. Regular rate and rhythm.  ABDOMEN: Soft, non-tender, bowel sounds are present. EXTREMITIES: Leg with erythema multiple abrasions and features of venous insufficiency. CNS: Alert awake but confused disoriented moving all extremities.   SKIN: warm and dry , skin with abrasions, lower extremity with signs of venous insufficiency.  Data Review: I have personally reviewed the following laboratory data and studies,  CBC: Recent Labs  Lab 09/11/2019 1223 09/13/19 0457  WBC 7.9 15.0*  NEUTROABS 5.6  --   HGB 15.7 15.8  HCT 51.2 52.4*  MCV 90.8 90.3  PLT 164 171   Basic Metabolic Panel: Recent Labs  Lab 09/13/2019 1223 09/13/19 0457  NA 144 146*  K 4.3 4.4  CL 108 102  CO2 29 35*  GLUCOSE 112* 105*  BUN 36* 34*  CREATININE 1.14 1.23  CALCIUM 8.8* 8.6*   Liver Function Tests: Recent Labs  Lab 09/04/2019 1223  AST 23  ALT 29  ALKPHOS 80  BILITOT 0.7  PROT 7.1  ALBUMIN 3.1*   No results for input(s): LIPASE, AMYLASE in the last 168 hours. No results for input(s): AMMONIA in the last 168 hours. Cardiac Enzymes: No results for input(s): CKTOTAL, CKMB, CKMBINDEX, TROPONINI in the last 168 hours. BNP (last 3 results) Recent Labs    09/13/2019 1223  BNP 610.8*    ProBNP (last 3 results) No  results for input(s): PROBNP in the last 8760 hours.  CBG: No results for input(s): GLUCAP in the last 168 hours. Recent Results (from the past 240 hour(s))  Respiratory Panel by RT PCR (Flu A&B, Covid) - Nasopharyngeal Swab     Status: None   Collection Time: 09/10/2019 12:23 PM   Specimen: Nasopharyngeal Swab  Result Value Ref Range Status   SARS Coronavirus 2 by RT PCR NEGATIVE NEGATIVE Final    Comment: (NOTE) SARS-CoV-2 target nucleic acids are NOT DETECTED. The SARS-CoV-2 RNA is generally detectable in upper respiratoy specimens during the acute phase of infection. The lowest concentration of SARS-CoV-2 viral copies this assay can detect is 131 copies/mL. A negative result does not preclude SARS-Cov-2 infection and should not be used as the sole basis for treatment or other patient management decisions. A negative result may occur with  improper specimen collection/handling, submission of specimen other than nasopharyngeal swab, presence of viral mutation(s) within the areas targeted by this assay, and inadequate number of viral copies (<131 copies/mL). A negative result must  be combined with clinical observations, patient history, and epidemiological information. The expected result is Negative. Fact Sheet for Patients:  https://www.moore.com/ Fact Sheet for Healthcare Providers:  https://www.young.biz/ This test is not yet ap proved or cleared by the [place] FDA and  has been authorized for detection and/or diagnosis of SARS-CoV-2 by FDA under an Emergency Use Authorization (EUA). This EUA will remain  in effect (meaning this test can be used) for the duration of the COVID-19 declaration under Section 564(b)(1) of the Act, 21 U.S.C. section 360bbb-3(b)(1), unless the authorization is terminated or revoked sooner.    Influenza A by PCR NEGATIVE NEGATIVE Final   Influenza B by PCR NEGATIVE NEGATIVE Final    Comment: (NOTE) The  Xpert Xpress SARS-CoV-2/FLU/RSV assay is intended as an aid in  the diagnosis of influenza from Nasopharyngeal swab specimens and  should not be used as a sole basis for treatment. Nasal washings and  aspirates are unacceptable for Xpert Xpress SARS-CoV-2/FLU/RSV  testing. Fact Sheet for Patients: https://www.moore.com/ Fact Sheet for Healthcare Providers: https://www.young.biz/ This test is not yet approved or cleared by the [place] FDA and  has been authorized for detection and/or diagnosis of SARS-CoV-2 by  FDA under an Emergency Use Authorization (EUA). This EUA will remain  in effect (meaning this test can be used) for the duration of the  Covid-19 declaration under Section 564(b)(1) of the Act, 21  U.S.C. section 360bbb-3(b)(1), unless the authorization is  terminated or revoked. Performed at [facility], [address] [place], [place] [address]      Studies: CT Head Wo Contrast  Result Date: 09/03/2019 CLINICAL DATA:  Fall EXAM: CT HEAD WITHOUT CONTRAST TECHNIQUE: Contiguous axial images were obtained from the base of the skull through the vertex without intravenous contrast. COMPARISON:  None. FINDINGS: Brain: No evidence of acute infarction, hemorrhage, hydrocephalus, extra-axial collection or mass lesion/mass effect. Mild periventricular white matter hypodensity. Vascular: No hyperdense vessel or unexpected calcification. Skull: Normal. Negative for fracture or focal lesion. Sinuses/Orbits: No acute finding. Other: None. IMPRESSION: No acute intracranial pathology. Mild small-vessel white matter disease. Electronically Signed   By: Lauralyn Primes M.D.   On: 09/03/2019 14:04   DG Chest Port 1 View  Result Date: 09/20/2019 CLINICAL DATA:  Shortness of breath. Tripped on curb leading to fall. Right rib pain. EXAM: PORTABLE CHEST 1 VIEW COMPARISON:  Radiograph 05/31/2019, CT 07/24/2019 FINDINGS: Cardiomegaly with  unchanged mediastinal contours. Vascular congestion with interstitial and bronchial thickening. No pneumothorax or confluent airspace disease. No large pleural effusion. Probable minimally displaced right lateral seventh rib fracture. IMPRESSION: 1. Probable minimally displaced right lateral seventh rib fracture. No pneumothorax. 2. Chronic cardiomegaly. Vascular congestion with suggestion of mild pulmonary edema. Electronically Signed   By: Narda Rutherford M.D.   On: 09/11/2019 13:39      Joycelyn Das, MD  [facility] 09/13/2019

## 2019-09-13 NOTE — Progress Notes (Signed)
PT Cancellation Note  Patient Details Name: Warren White MRN: [id number] DOB: [birth year]   Cancelled Treatment:    Reason Eval/Treat Not Completed: Patient at procedure or test/unavailable cardiac ultrasound   Marico Buckle,KATHrine E 09/13/2019, 11:22 AM Paulino Door, DPT Acute Rehabilitation Services Office: [phone]

## 2019-09-13 NOTE — Significant Event (Signed)
Rapid Response Event Note  Overview: Time Called: 1146 Time Arrived: 1155  Event Type: Respiratory Notified by bedside RN, patient's O2 Sats decreasing and a decrease in BP. Patient on 4L [place]. Patient did receive lasix earlier in the shift, however, patient had not urinated yet. Asked for bladder scan, patient having >636mls in bladder. Upon arrival, MD Pokhrel at bedside. Respiratory at bedside as well.   Initial Focused Assessment: Neuro: Alert and oriented to self and place, patient able to follow basic commands and move all extremities.  Cardiac: NSR, HR 80s-90s, S1 and S2 heard upon auscultation, pulses 1+ in radial and pedal pulses respectively.  Pulmonary: Lungs clear and diminished in upper and lower lung fields. Patient did have some congestion in upper airway this was removed with coughing and suctioning with hard-tip yankauer.    Interventions: CXR and ABG ordered STAT by MD  Foley ordered placed  Plan of Care (if not transferred): Continue to monitor patient on telemetry for now. If patient has increase in work of breathing or change in clinical status please call Rapid Response. Would recommend keeping patient NPO until a swallow evaluation is performed.          Ramona Ruark C

## 2019-09-13 NOTE — Progress Notes (Signed)
Recd pt from off going [employer]. Condition stable. No change in initial am assessment at this time. Cont with audible rhonchi. Sat 89-94%. Pt refuses to wear [place] o2 in his nose. He places prongs in his mouth. Educated

## 2019-09-13 NOTE — Progress Notes (Signed)
PT Cancellation Note  Patient Details Name: Warren White MRN: [id number] DOB: 11-12-1925   Cancelled Treatment:    Reason Eval/Treat Not Completed: Medical issues which prohibited therapy Rapid response event noted.  RN requests hold therapy today.  Will check back as schedule permits.   Noelie Renfrow,KATHrine E 09/13/2019, 3:38 PM Thomasene Mohair PT, DPT Acute Rehabilitation Services Office: [phone]

## 2019-09-14 DIAGNOSIS — R531 Weakness: Secondary | ICD-10-CM

## 2019-09-14 DIAGNOSIS — Z7189 Other specified counseling: Secondary | ICD-10-CM

## 2019-09-14 DIAGNOSIS — I5031 Acute diastolic (congestive) heart failure: Secondary | ICD-10-CM

## 2019-09-14 DIAGNOSIS — Z515 Encounter for palliative care: Secondary | ICD-10-CM

## 2019-09-14 LAB — URINALYSIS, ROUTINE W REFLEX MICROSCOPIC
Bilirubin Urine: NEGATIVE
Glucose, UA: NEGATIVE mg/dL
Ketones, ur: NEGATIVE mg/dL
Nitrite: NEGATIVE
Protein, ur: 30 mg/dL — AB
RBC / HPF: >50 RBC/hpf — ABNORMAL HIGH (ref 0–5)
Specific Gravity, Urine: 1.013 (ref 1.005–1.030)
pH: 5 (ref 5.0–8.0)

## 2019-09-14 LAB — MAGNESIUM: Magnesium: 1.9 mg/dL (ref 1.7–2.4)

## 2019-09-14 LAB — BASIC METABOLIC PANEL
Anion gap: 12 (ref 5–15)
BUN: 37 mg/dL — ABNORMAL HIGH (ref 8–23)
CO2: 33 mmol/L — ABNORMAL HIGH (ref 22–32)
Calcium: 8.4 mg/dL — ABNORMAL LOW (ref 8.9–10.3)
Chloride: 101 mmol/L (ref 98–111)
Creatinine, Ser: 1.24 mg/dL (ref 0.61–1.24)
GFR calc Af Amer: 59 mL/min — ABNORMAL LOW (ref >60)
GFR calc non Af Amer: 51 mL/min — ABNORMAL LOW (ref >60)
Glucose, Bld: 105 mg/dL — ABNORMAL HIGH (ref 70–99)
Potassium: 4.2 mmol/L (ref 3.5–5.1)
Sodium: 146 mmol/L — ABNORMAL HIGH (ref 135–145)

## 2019-09-14 LAB — CBC
HCT: 52.2 % — ABNORMAL HIGH (ref 39.0–52.0)
Hemoglobin: 15.4 g/dL (ref 13.0–17.0)
MCH: 27.2 pg (ref 26.0–34.0)
MCHC: 29.5 g/dL — ABNORMAL LOW (ref 30.0–36.0)
MCV: 92.2 fL (ref 80.0–100.0)
Platelets: 160 10*3/uL (ref 150–400)
RBC: 5.66 MIL/uL (ref 4.22–5.81)
RDW: 19 % — ABNORMAL HIGH (ref 11.5–15.5)
WBC: 13.4 10*3/uL — ABNORMAL HIGH (ref 4.0–10.5)
nRBC: 0.1 % (ref 0.0–0.2)

## 2019-09-14 MED ORDER — HYDROMORPHONE HCL 1 MG/ML IJ SOLN: 0.5000 mg | INTRAMUSCULAR | Status: DC | PRN

## 2019-09-14 NOTE — Progress Notes (Addendum)
PROGRESS NOTE    Bynum BellowsWouter Mantel  UJW:[id number]:[id number] DOB: [birth year] DOA: 09/06/2019 PCP: Ardith DarkParker, Caleb M, MD   Brief Narrative:86 y.o.malewith medical history ofCOPD, chronic diastolic CHF, HTN, psoriasis, RLS, GERD was brought into the hospital by his daughter for history of recurrent falls for the last 3 to 4 days with complaints of dizziness.  Patient was supposed to be on Lasix at home but has not been taking it since it makes him urinate quite a bit.  He has not been taking his metoprolol regularly as well.  Patient was then admitted to hospital for recurrent falls.  Assessment & Plan:   Principal Problem:   Acute diastolic CHF (congestive heart failure) (HCC) Active Problems:   COPD (chronic obstructive pulmonary disease) (HCC)   BPV (benign positional vertigo)   RLS (restless legs syndrome)   GERD (gastroesophageal reflux disease)   BPH (benign prostatic hyperplasia)   Peripheral artery disease (HCC)   Falls   Closed rib fracture   Prolonged QT interval   Acute diastolic congestive heart failure.  Patient is currently on IV Lasix twice a day for pulmonary vascular congestion.  Chest x-ray showed displaced right lateral seventh rib fracture and vascular congestion with mild pulmonary edema.  Continue strict intake and output charting, daily weights.  He is negative by 4200  Continue supplemental oxygen.   2D echocardiogram--left ventricular ejection fraction is 30%.  Moderately severe decreased function.  Confusion, disorientation likely secondary to mild alcohol withdrawal, respiratory distress.  ABG 7.3 7/63/57.  We will put patient on as needed Ativan for possible withdrawal.  Frequent falls.  With history of dizziness.  Will need vestibular evaluation.  PT evaluation.  Closed right seventh rib fracture Continue incentive spirometry.  Focus on analgesia.  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  Continue oxygen  nebulizer and supportive care.  Patient currently smokes.  Consider nicotine patch.   RLS (restless legs syndrome) - cont Mirapex  GERD (gastroesophageal reflux disease) - Protonix  Prolonged QT interval - follow, avoid QT prolonging meds as possible.  Alcohol use disorder.  Possibility of mild withdrawal at this time.  We will put the patient on as needed Ativan.  Leukocytosis WBC count is 13.4 down from 15 at the time of admission.  Estimated body mass index is 32.25 kg/m as calculated from the following:   Height as of this encounter: 5\' 8"  (1.727 m).   Weight as of this encounter: 96.2 kg.  DVT prophylaxis: LOVENOX Code Status: DNR Family Communication: Bedside Disposition Plan: Patient came from home likely will need to go to rehab on discharge versus home with hospice Barriers to discharge acute CHF exacerbation Consultants:   PALLIATIVE  Procedures: NONE Antimicrobials: None  Subjective: Patient resting in bed very gurgly When I saw him earlier today patient requested to have a cup of coffee and banana he wanted something sweet to eat  Foley catheter with urine appears blood tinged. Objective: Vitals:   09/13/19 2357 09/14/19 0535 09/14/19 0814 09/14/19 0822  BP: 131/89 132/72    Pulse: 96 97    Resp: (!) 22 (!) 22    Temp: 98.1 F (36.7 C) 97.7 F (36.5 C)    TempSrc: Oral Oral    SpO2: 92% 92% 97% 97%  Weight:      Height:        Intake/Output Summary (Last 24 hours) at 09/14/2019 1313 Last data filed at 09/14/2019 0510 Gross per 24 hour  Intake 480 ml  Output 1675 ml  Net -1195 ml   Filed Weights   09/02/2019 1143 09/13/19 0453  Weight: 96.6 kg 96.2 kg    Examination:  General exam: Appears calm and comfortable  Respiratory system: Scattered rhonchi bilaterally to auscultation. Respiratory effort normal. Cardiovascular system: S1 & S2 heard, RRR. No JVD, murmurs, rubs, gallops or clicks. No pedal edema. Gastrointestinal system:  Abdomen is nondistended, soft and nontender. No organomegaly or masses felt. Normal bowel sounds heard. Central nervous system: Alert and oriented. No focal neurological deficits. Extremities: 2+ pitting edema  skin: No rashes, lesions or ulcers Psychiatry: Judgement and insight appear normal. Mood & affect appropriate.     Data Reviewed: I have personally reviewed following labs and imaging studies  CBC: Recent Labs  Lab 09/09/2019 1223 09/13/19 0457 09/14/19 0435  WBC 7.9 15.0* 13.4*  NEUTROABS 5.6  --   --   HGB 15.7 15.8 15.4  HCT 51.2 52.4* 52.2*  MCV 90.8 90.3 92.2  PLT 164 171 160   Basic Metabolic Panel: Recent Labs  Lab 09/09/2019 1223 09/13/19 0457 09/14/19 0435  NA 144 146* 146*  K 4.3 4.4 4.2  CL 108 102 101  CO2 29 35* 33*  GLUCOSE 112* 105* 105*  BUN 36* 34* 37*  CREATININE 1.14 1.23 1.24  CALCIUM 8.8* 8.6* 8.4*  MG  --   --  1.9   GFR: Estimated Creatinine Clearance: 43.6 mL/min (by C-G formula based on SCr of 1.24 mg/dL). Liver Function Tests: Recent Labs  Lab 08/26/2019 1223  AST 23  ALT 29  ALKPHOS 80  BILITOT 0.7  PROT 7.1  ALBUMIN 3.1*   No results for input(s): LIPASE, AMYLASE in the last 168 hours. No results for input(s): AMMONIA in the last 168 hours. Coagulation Profile: No results for input(s): INR, PROTIME in the last 168 hours. Cardiac Enzymes: No results for input(s): CKTOTAL, CKMB, CKMBINDEX, TROPONINI in the last 168 hours. BNP (last 3 results) No results for input(s): PROBNP in the last 8760 hours. HbA1C: No results for input(s): HGBA1C in the last 72 hours. CBG: No results for input(s): GLUCAP in the last 168 hours. Lipid Profile: No results for input(s): CHOL, HDL, LDLCALC, TRIG, CHOLHDL, LDLDIRECT in the last 72 hours. Thyroid Function Tests: No results for input(s): TSH, T4TOTAL, FREET4, T3FREE, THYROIDAB in the last 72 hours. Anemia Panel: No results for input(s): VITAMINB12, FOLATE, FERRITIN, TIBC, IRON, RETICCTPCT  in the last 72 hours. Sepsis Labs: No results for input(s): PROCALCITON, LATICACIDVEN in the last 168 hours.  Recent Results (from the past 240 hour(s))  Respiratory Panel by RT PCR (Flu A&B, Covid) - Nasopharyngeal Swab     Status: None   Collection Time: 09/19/2019 12:23 PM   Specimen: Nasopharyngeal Swab  Result Value Ref Range Status   SARS Coronavirus 2 by RT PCR NEGATIVE NEGATIVE Final    Comment: (NOTE) SARS-CoV-2 target nucleic acids are NOT DETECTED. The SARS-CoV-2 RNA is generally detectable in upper respiratoy specimens during the acute phase of infection. The lowest concentration of SARS-CoV-2 viral copies this assay can detect is 131 copies/mL. A negative result does not preclude SARS-Cov-2 infection and should not be used as the sole basis for treatment or other patient management decisions. A negative result may occur with  improper specimen collection/handling, submission of specimen other than nasopharyngeal swab, presence of viral mutation(s) within the areas targeted by this assay, and inadequate number of viral copies (<131 copies/mL). A negative result must be combined with clinical observations, patient history,  and epidemiological information. The expected result is Negative. Fact Sheet for Patients:  PinkCheek.be Fact Sheet for Healthcare Providers:  GravelBags.it This test is not yet ap proved or cleared by the [place] FDA and  has been authorized for detection and/or diagnosis of SARS-CoV-2 by FDA under an Emergency Use Authorization (EUA). This EUA will remain  in effect (meaning this test can be used) for the duration of the COVID-19 declaration under Section 564(b)(1) of the Act, 21 U.S.C. section 360bbb-3(b)(1), unless the authorization is terminated or revoked sooner.    Influenza A by PCR NEGATIVE NEGATIVE Final   Influenza B by PCR NEGATIVE NEGATIVE Final    Comment: (NOTE) The Xpert  Xpress SARS-CoV-2/FLU/RSV assay is intended as an aid in  the diagnosis of influenza from Nasopharyngeal swab specimens and  should not be used as a sole basis for treatment. Nasal washings and  aspirates are unacceptable for Xpert Xpress SARS-CoV-2/FLU/RSV  testing. Fact Sheet for Patients: PinkCheek.be Fact Sheet for Healthcare Providers: GravelBags.it This test is not yet approved or cleared by the [place] FDA and  has been authorized for detection and/or diagnosis of SARS-CoV-2 by  FDA under an Emergency Use Authorization (EUA). This EUA will remain  in effect (meaning this test can be used) for the duration of the  Covid-19 declaration under Section 564(b)(1) of the Act, 21  U.S.C. section 360bbb-3(b)(1), unless the authorization is  terminated or revoked. Performed at [facility], [place] [address], [place], [place] [address]          Radiology Studies: CT Head Wo Contrast  Result Date: 09/04/2019 CLINICAL DATA:  Fall EXAM: CT HEAD WITHOUT CONTRAST TECHNIQUE: Contiguous axial images were obtained from the base of the skull through the vertex without intravenous contrast. COMPARISON:  None. FINDINGS: Brain: No evidence of acute infarction, hemorrhage, hydrocephalus, extra-axial collection or mass lesion/mass effect. Mild periventricular white matter hypodensity. Vascular: No hyperdense vessel or unexpected calcification. Skull: Normal. Negative for fracture or focal lesion. Sinuses/Orbits: No acute finding. Other: None. IMPRESSION: No acute intracranial pathology. Mild small-vessel white matter disease. Electronically Signed   By: Eddie Candle M.D.   On: 08/26/2019 14:04   DG CHEST PORT 1 VIEW  Result Date: 09/13/2019 CLINICAL DATA:  Decreasing O2 sats. EXAM: PORTABLE CHEST 1 VIEW COMPARISON:  08/30/2019 FINDINGS: 1228 hours. The cardio pericardial silhouette is enlarged. There is pulmonary vascular  congestion without overt pulmonary edema. Interstitial markings are diffusely coarsened with chronic features. Patchy airspace opacity in the left mid and lower lung as well as the right base is similar to prior. IMPRESSION: Cardiomegaly with vascular congestion and similar appearance patchy airspace opacity in the lung bases. Electronically Signed   By: Misty Stanley M.D.   On: 09/13/2019 13:21   DG Chest Port 1 View  Result Date: 09/22/2019 CLINICAL DATA:  Shortness of breath. Tripped on curb leading to fall. Right rib pain. EXAM: PORTABLE CHEST 1 VIEW COMPARISON:  Radiograph 05/31/2019, CT 07/24/2019 FINDINGS: Cardiomegaly with unchanged mediastinal contours. Vascular congestion with interstitial and bronchial thickening. No pneumothorax or confluent airspace disease. No large pleural effusion. Probable minimally displaced right lateral seventh rib fracture. IMPRESSION: 1. Probable minimally displaced right lateral seventh rib fracture. No pneumothorax. 2. Chronic cardiomegaly. Vascular congestion with suggestion of mild pulmonary edema. Electronically Signed   By: Keith Rake M.D.   On: 08/31/2019 13:39   ECHOCARDIOGRAM COMPLETE  Result Date: 09/13/2019    ECHOCARDIOGRAM REPORT   Patient Name:   Warren White  Date of Exam: 09/13/2019 Medical Rec #:  [id number]     Height:       68.0 in Accession #:    [id number]    Weight:       212.1 lb Date of Birth:  1925/10/10     BSA:          2.095 m Patient Age:    86 years      BP:           100/71 mmHg Patient Gender: M             HR:           72 bpm. Exam Location:  Inpatient Procedure: 2D Echo, Color Doppler and Cardiac Doppler Indications:    Acute Respiratory Insufficiency R06.89  History:        Patient has prior history of Echocardiogram examinations, most                 recent 04/05/2017. CHF, COPD; Risk Factors:Hypertension.  Sonographer:    Irving Burton Senior RDCS Referring Phys: [id number] SAIMA RIZWAN IMPRESSIONS  1. Left ventricular ejection fraction, by  estimation, is 30%. The left ventricle has moderate to severely decreased function. Left ventricular endocardial border not optimally defined to evaluate regional wall motion. Left ventricular diastolic parameters are indeterminate, but suggestive of increased filling pressure.  2. Right ventricular systolic function is mildly reduced. The right ventricular size is severely enlarged. There is moderately elevated pulmonary artery systolic pressure. The estimated right ventricular systolic pressure is 47.3 mmHg.  3. Left atrial size was mildly dilated.  4. Right atrial size was severely dilated.  5. The mitral valve is abnormal. No evidence of mitral valve regurgitation. No evidence of mitral stenosis.  6. The aortic valve is abnormal. Aortic valve regurgitation is not visualized. No aortic stenosis is present.  7. The inferior vena cava is dilated in size with <50% respiratory variability, suggesting right atrial pressure of 15 mmHg. FINDINGS  Left Ventricle: Left ventricular ejection fraction, by estimation, is 30%. The left ventricle has moderate to severely decreased function. Left ventricular endocardial border not optimally defined to evaluate regional wall motion. The left ventricular internal cavity size was normal in size. There is no left ventricular hypertrophy. Left ventricular diastolic parameters are indeterminate. Right Ventricle: The right ventricular size is severely enlarged. No increase in right ventricular wall thickness. Right ventricular systolic function is mildly reduced. There is moderately elevated pulmonary artery systolic pressure. The tricuspid regurgitant velocity is 2.84 m/s, and with an assumed right atrial pressure of 15 mmHg, the estimated right ventricular systolic pressure is 47.3 mmHg. Left Atrium: Left atrial size was mildly dilated. Right Atrium: Right atrial size was severely dilated. Pericardium: There is no evidence of pericardial effusion. Mitral Valve: The mitral valve is  abnormal. There is moderate calcification of the mitral valve leaflet(s). Moderately decreased mobility of the mitral valve leaflets. No evidence of mitral valve regurgitation. No evidence of mitral valve stenosis. Tricuspid Valve: The tricuspid valve is normal in structure. Tricuspid valve regurgitation is mild . No evidence of tricuspid stenosis. Aortic Valve: The aortic valve is abnormal. Aortic valve regurgitation is not visualized. No aortic stenosis is present. Pulmonic Valve: The pulmonic valve was not well visualized. Pulmonic valve regurgitation is trivial. No evidence of pulmonic stenosis. Aorta: The aortic root is normal in size and structure. Venous: The inferior vena cava is dilated in size with less than 50% respiratory variability, suggesting right atrial pressure of 15 mmHg. IAS/Shunts:  No atrial level shunt detected by color flow Doppler.  LEFT VENTRICLE PLAX 2D LVIDd:         4.90 cm      Diastology LVIDs:         4.50 cm      LV e' lateral: 2.93 cm/s LV PW:         0.80 cm      LV e' medial:  2.33 cm/s LV IVS:        0.80 cm LVOT diam:     2.40 cm LV SV:         58 LV SV Index:   28 LVOT Area:     4.52 cm  LV Volumes (MOD) LV vol d, MOD A2C: 193.0 ml LV vol d, MOD A4C: 154.0 ml LV vol s, MOD A2C: 155.0 ml LV vol s, MOD A4C: 117.0 ml LV SV MOD A2C:     38.0 ml LV SV MOD A4C:     154.0 ml LV SV MOD BP:      37.5 ml RIGHT VENTRICLE RV S prime:     9.90 cm/s TAPSE (M-mode): 2.0 cm LEFT ATRIUM             Index       RIGHT ATRIUM           Index LA diam:        5.00 cm 2.39 cm/m  RA Area:     38.50 cm LA Vol (A2C):   81.1 ml 38.71 ml/m RA Volume:   178.00 ml 84.95 ml/m LA Vol (A4C):   49.9 ml 23.79 ml/m LA Biplane Vol: 73.8 ml 35.22 ml/m  AORTIC VALVE LVOT Vmax:   75.50 cm/s LVOT Vmean:  52.000 cm/s LVOT VTI:    0.129 m  AORTA Ao Root diam: 3.40 cm TRICUSPID VALVE TR Peak grad:   32.3 mmHg TR Vmax:        284.00 cm/s  SHUNTS Systemic VTI:  0.13 m Systemic Diam: 2.40 cm Weston Brass MD  Electronically signed by Weston Brass MD Signature Date/Time: 09/13/2019/5:06:25 PM    Final         Scheduled Meds: . Chlorhexidine Gluconate Cloth  6 each Topical Daily  . enoxaparin (LOVENOX) injection  40 mg Subcutaneous Q24H  . furosemide  40 mg Intravenous BID  . ipratropium-albuterol  3 mL Nebulization TID  . metoprolol succinate  25 mg Oral Daily  . mirtazapine  15 mg Oral QHS  . pantoprazole  40 mg Oral Daily  . pramipexole  0.25 mg Oral BID  . traZODone  200 mg Oral QHS   Continuous Infusions:   LOS: 2 days     Alwyn Ren, MD  09/14/2019, 1:13 PM

## 2019-09-14 NOTE — Progress Notes (Signed)
SLP Cancellation Note  Patient Details Name: Warren White MRN: [id number] DOB: 09/01/1925   Cancelled treatment:       Reason Eval/Treat Not Completed: Patient's level of consciousness. Patient had been given ativan this morning and was still sleeping and not able to be aroused at 1130. Per RN, he has been doing well with diet of Dys 1, thin liquids and that he likely could have some more solid foods. Will check next date, SLP schedule permitting.   Angela Nevin, MA, CCC-SLP Speech Therapy [facility]

## 2019-09-14 NOTE — Evaluation (Signed)
Physical Therapy Evaluation/Vestibular Assessment Patient Details Name: Warren White MRN: [id number] DOB: 28-Dec-1925 Today's Date: 09/14/2019   History of Present Illness  86 y.o. male admitted on 09/22/2019 for recurrent falls, dizziness.  Dx with acute diastolic CHF, confusion/disorientation (likely due to mild ETOH withdraw, repiratory distress), closed R 7th rib fx, COPD, prolonged QT interval and leukocytosis.  Pt with significant PMH of RLS, hypertensive heart disease, emphysema of lung, psoriasis, L THA, back surgery.    Clinical Impression  Pt with difficult vestibular assessment due to difficulty following complex commands.  Symptoms during exam were consistent with a posterior canal BPPV, however, given the significant amount of assist and R rib pain during mobility, it is going to be incredibly hard to treat a posterior canal BPPV.  He needs two person heavy physical assist to move and may be safest d/c to SNF with palliative/hospice follow up.  If he does go home he will likely need ambulance transport.  VSS on 3.5L O2 [place] and he has recurring productive coughing throughout our session.   PT to follow acutely for deficits listed below.      Follow Up Recommendations SNF;Supervision/Assistance - 24 hour    Equipment Recommendations  Hospital bed;3in1 (PT);Other (comment)(ambulance transport)    Recommendations for Other Services OT consult     Precautions / Restrictions Precautions Precautions: Fall;Other (comment) Precaution Comments: vertigo, bil LEs weeping wounds Restrictions Weight Bearing Restrictions: No      Mobility  Bed Mobility Overal bed mobility: Needs Assistance Bed Mobility: Supine to Sit;Sit to Supine     Supine to sit: Mod assist;HOB elevated Sit to supine: Mod assist   General bed mobility comments: Mod assist to support trunk to come to EOB, lighter assist at bil LEs to progress to EOB.  Mod assit to support trunk and separately help lift both legs back  into bed to return to supine.   Transfers Overall transfer level: Needs assistance Equipment used: 2 person hand held assist Transfers: Sit to/from Stand Sit to Stand: Mod assist;+2 physical assistance         General transfer comment: Mod two person physical assist to come to standing EOB.  Pt with posterior preference and difficulty getting his feet under him to catch his balance.   Ambulation/Gait Ambulation/Gait assistance: Mod assist;+2 physical assistance           General Gait Details: Attempted to side step with two person assist, however, significant difficulty and unable to take more than one unstable side step.          Balance Overall balance assessment: Needs assistance Sitting-balance support: Feet supported;Bilateral upper extremity supported;Single extremity supported Sitting balance-Leahy Scale: Fair Sitting balance - Comments: close supervison EOB, sat for >20 mins to eat and complete visual assessment with PT Postural control: Posterior lean Standing balance support: Bilateral upper extremity supported Standing balance-Leahy Scale: Poor Standing balance comment: two person mod assist in standing.                09/14/19 2211  Symptom Behavior  Subjective history of current problem pt with dizziness and frequent falls x 1 week.  h/o BPPV in the past, short duration intense spinning most noticeable with supine <-> sit transitions.   Type of Dizziness  Spinning  Frequency of Dizziness intermittent  Duration of Dizziness short 10 seconds  Symptom Nature Motion provoked  Aggravating Factors Activity in general;Supine to sit  Relieving Factors Lying supine;Rest  Progression of Symptoms No change since onset  History  of similar episodes yes, has had his crystals worked on in the past.   Oculomotor Exam  Oculomotor Alignment Normal (droopy lids)  Spontaneous Absent  Gaze-induced  Absent  Smooth Pursuits Intact  Comment Very difficult to get him to  follow commands even just to track.    Oculomotor Exam-Fixation Suppressed   Left Head Impulse pt very guarded for HIT unable to preform successfully (fixation not supressed)  Right Head Impulse pt very guarded with HIT, unable to preform successfully (fixation NOT supressed)  Vestibulo-Ocular Reflex  VOR 1 Head Only (x 1 viewing) horizontal good speed and no symptoms, vertical slow and (+) symptoms.   Auditory  Comments HOH at basline  Positional Testing  Dix-Hallpike Dix-Hallpike Right;Dix-Hallpike Left  Horizontal Canal Testing Horizontal Canal Right;Horizontal Canal Left  Dix-Hallpike Right  Dix-Hallpike Right Duration used bed, unable to elicit symptoms with slow moving bed, pt reporting he cannot breathe in this position  Dix-Hallpike Right Symptoms No nystagmus  Dix-Hallpike Left  Dix-Hallpike Left Duration used bed, unable to elicit symptoms, pt reports difficulty breathing in testing position.   Dix-Hallpike Left Symptoms No nystagmus  Horizontal Canal Right  Horizontal Canal Right Duration 0  Horizontal Canal Right Symptoms Normal  Horizontal Canal Left  Horizontal Canal Left Duration 0  Horizontal Canal Left Symptoms Normal   09/14/2019 pt reported no dizziness coming up to sitting, but he reported dizziness returning to supine from sitting <30 seconds, however, came straight down and unable to see any nystagmus to indicate side.  He is unable to tolerate dix hallpike or modified dix hallpike using bed positioning.  I suspect he may have a posterior canal BPPV, but between his inability to breathe and his R rib pain in testing positions I do not think he could tolerate treatment at this time.                   Pertinent Vitals/Pain Pain Assessment: Faces Faces Pain Scale: Hurts whole lot Pain Location: R rib Pain Descriptors / Indicators: Grimacing;Guarding Pain Intervention(s): Limited activity within patient's tolerance;Monitored during session;Repositioned     Home Living Family/patient expects to be discharged to:: Private residence Living Arrangements: Spouse/significant other Available Help at Discharge: Family Type of Home: House Home Access: Stairs to enter Entrance Stairs-Rails: None Entrance Stairs-Number of Steps: 2 Home Layout: Two level;Other (Comment)(office upstairs) Home Equipment: Walker - 2 wheels(just got a RW)      Prior Function Level of Independence: Needs assistance   Gait / Transfers Assistance Needed: for the past week pt has been falling, before then he was ambulatory, but needed assistance to dress his lower body and bathe (his son would help with bathing).       Comments: Per pt he was crawling up to his office on the second floor, per wife he has been unable to walk for the past week, falling several times.      Hand Dominance   Dominant Hand: Right    Extremity/Trunk Assessment   Upper Extremity Assessment Upper Extremity Assessment: Defer to OT evaluation    Lower Extremity Assessment Lower Extremity Assessment: Generalized weakness;RLE deficits/detail;LLE deficits/detail RLE Deficits / Details: bil LEs with weeping lower leg wounds.  Per pt it hurts only when bumped, decreased sensation bil lower legs, and strength limited RLE Sensation: decreased light touch LLE Deficits / Details: bil LEs with weeping lower leg wounds.  Per pt it hurts only when bumped, decreased sensation bil lower legs, and strength limited LLE Sensation: decreased light  touch    Cervical / Trunk Assessment Cervical / Trunk Assessment: Normal  Communication   Communication: HOH  Cognition Arousal/Alertness: Awake/alert Behavior During Therapy: WFL for tasks assessed/performed Overall Cognitive Status: History of cognitive impairments - at baseline                                 General Comments: Seems to have some mild cognitive deficits that are his baseline. Also HOH which likey does not help.        General Comments General comments (skin integrity, edema, etc.): Pt on 3.5 L O2 [place] at baseline, so kept O2 on for session, pt with productive coughing throughout, painful R rib, so educated on bracing with pillow on R rib during coughing.          Assessment/Plan    PT Assessment Patient needs continued PT services  PT Problem List Decreased strength;Decreased activity tolerance;Decreased balance;Decreased mobility;Decreased cognition;Decreased knowledge of use of DME;Decreased safety awareness;Decreased knowledge of precautions;Pain;Obesity;Impaired sensation       PT Treatment Interventions DME instruction;Gait training;Stair training;Functional mobility training;Therapeutic activities;Therapeutic exercise;Balance training;Neuromuscular re-education;Cognitive remediation;Patient/family education;Wheelchair mobility training    PT Goals (Current goals can be found in the Care Plan section)  Acute Rehab PT Goals Patient Stated Goal: to get his crystals back in place PT Goal Formulation: With patient/family Time For Goal Achievement: 09/28/19 Potential to Achieve Goals: Good    Frequency Min 2X/week   Barriers to discharge Decreased caregiver support pt needs heavy two person assist at this time.        AM-PAC PT "6 Clicks" Mobility  Outcome Measure Help needed turning from your back to your side while in a flat bed without using bedrails?: A Lot Help needed moving from lying on your back to sitting on the side of a flat bed without using bedrails?: A Lot Help needed moving to and from a bed to a chair (including a wheelchair)?: A Lot Help needed standing up from a chair using your arms (e.g., wheelchair or bedside chair)?: A Lot Help needed to walk in hospital room?: Total Help needed climbing 3-5 steps with a railing? : Total 6 Click Score: 10    End of Session                 PT Time Calculation  PT Start Time (ACUTE ONLY) 1609  PT Stop Time (ACUTE ONLY)  1701  PT Time Calculation (min) (ACUTE ONLY) 52 min       PT General Charges  $$ ACUTE PT VISIT 1 Visit  PT Evaluation  $PT Eval Moderate Complexity 1 Mod  PT Treatments  $Therapeutic Activity 23-37 mins    Equipment Utilized During Treatment: Oxygen Activity Tolerance: Patient limited by pain;Other (comment)(limited by dizziness) Patient left: in bed;with call bell/phone within reach;with bed alarm set Nurse Communication: Mobility status PT Visit Diagnosis: Muscle weakness (generalized) (M62.81);Difficulty in walking, not elsewhere classified (R26.2);Dizziness and giddiness (R42);Pain Pain - Right/Left: Right Pain - part of body: (rib)    Corinna Capra, PT, DPT  Acute Rehabilitation [phone] pager #[phone] office      Lurena Joiner B Makiah Foye 09/14/2019, 10:08 PM

## 2019-09-14 NOTE — Progress Notes (Signed)
09/14/2019 PT evaluation completed.  Full note to follow.  Pt is heavy two person assist to stand EOB, unable to side step.  We are very limited in our ability to treat his vertigo as he requires so much physical assistance to move and has significant R rib pain. I would recommend post acute SNF placement as he needs significant physical assist.  If he does go home with home hospice he will likely need ambulance transport home.  Thanks,     Corinna Capra, PT, DPT  Acute Rehabilitation [phone] pager [phone] office

## 2019-09-14 NOTE — Consult Note (Signed)
Consultation Note Date: 09/14/2019   Patient Name: Warren White  DOB: [birth year]  MRN: [id number]  Age / Sex: 86 y.o., male  PCP: Vivi Barrack, MD Referring Physician: Georgette Shell, MD  Reason for Consultation: Establishing goals of care  HPI/Patient Profile: 86 y.o. male  admitted on 09/11/2019   Clinical Assessment and Goals of Care: 86 year old gentleman known to palliative medicine service seen in consultation in a previous hospitalization, history of COPD, chronic diastolic congestive heart failure, hypertension psoriasis restless leg syndrome and gastroesophageal reflux disease.  Patient admitted to hospital medicine service for acute diastolic congestive heart failure exacerbation, recurrent falls, worsening lower extremity edema.  Patient's hospital course complicated by ongoing confusion and disorientation, possibly mild alcohol withdrawal.  Also has underlying COPD.  Palliative consultation for goals of care discussions has been requested.  Patient received Ativan and is resting comfortably.  Does not appear to have nonverbal gestures of distress or discomfort.  Medications history noted.  Discussed with bedside RN.  Call placed twice and successful in reaching daughter Kristian Covey at [phone].  Patient currently on 4 L supplemental oxygen via nasal cannula.  Oral intake is reasonable.  In previous palliative consultation, it is noted that the patient simply does not like hospital environment, does not like being admitted.  Please note additional summary of recommendations.  Palliative to follow.  Palliative medicine is specialized medical care for people living with serious illness. It focuses on providing relief from the symptoms and stress of a serious illness. The goal is to improve quality of life for both the patient and the family.  Goals of care: Broad aims of medical therapy in  relation to the patient's values and preferences. Our aim is to provide medical care aimed at enabling patients to achieve the goals that matter most to them, given the circumstances of their particular medical situation and their constraints.    NEXT OF KIN Wife, daughter.   SUMMARY OF RECOMMENDATIONS    Agree with DNR Add IV Dilaudid PRN, continue current mode of care Home with hospice versus SNF rehab palliative is recommended.  Thank you for the consult.   Code Status/Advance Care Planning:  DNR    Symptom Management:    will add low dose IV PRN opioids for pain and/or shortness of breath.   Palliative Prophylaxis:   Delirium Protocol   Psycho-social/Spiritual:   Desire for further Chaplaincy support:yes  Additional Recommendations: Education on Hospice  Prognosis:   < 3 months  Discharge Planning: Home with Hospice versus SNF rehab with palliative.       Primary Diagnoses: Present on Admission: . (Resolved) Anasarca . COPD (chronic obstructive pulmonary disease) ([place]) . BPV (benign positional vertigo) . RLS (restless legs syndrome) . GERD (gastroesophageal reflux disease) . BPH (benign prostatic hyperplasia) . Peripheral artery disease ([place])   I have reviewed the medical record, interviewed the patient and family, and examined the patient. The following aspects are pertinent.  Past Medical History:  Diagnosis Date  . COPD (chronic obstructive pulmonary  disease) ([facility])   . Depression   . Emphysema of lung ([facility])   . GERD (gastroesophageal reflux disease)   . Hypertensive heart disease with chronic diastolic congestive heart failure ([facility]) 03/2017   However no edema noted.  EF had improved from 40 and 45% to 50-55% by echo in January 2019.  Moderate LVH with diastolic dysfunction noted.  . Psoriasis   . Restless leg    Social History   Socioeconomic History  . Marital status: Married    Spouse name: Not on file  . Number of children: Not on file    . Years of education: Not on file  . Highest education level: Not on file  Occupational History  . Not on file  Tobacco Use  . Smoking status: Current Every Day Smoker    Types: Pipe  . Smokeless tobacco: Never Used  Substance and Sexual Activity  . Alcohol use: Yes    Alcohol/week: 14.0 standard drinks    Types: 14 Standard drinks or equivalent per week    Comment: occ  . Drug use: No  . Sexual activity: Never  Other Topics Concern  . Not on file  Social History Narrative  . Not on file   Social Determinants of Health   Financial Resource Strain:   . Difficulty of Paying Living Expenses:   Food Insecurity:   . Worried About Programme researcher, broadcasting/film/video in the Last Year:   . Barista in the Last Year:   Transportation Needs:   . Freight forwarder (Medical):   Marland Kitchen Lack of Transportation (Non-Medical):   Physical Activity:   . Days of Exercise per Week:   . Minutes of Exercise per Session:   Stress:   . Feeling of Stress :   Social Connections:   . Frequency of Communication with Friends and Family:   . Frequency of Social Gatherings with Friends and Family:   . Attends Religious Services:   . Active Member of Clubs or Organizations:   . Attends Banker Meetings:   Marland Kitchen Marital Status:    Family History  Problem Relation Age of Onset  . Restless legs syndrome Mother   . Cataracts Mother    Scheduled Meds: . Chlorhexidine Gluconate Cloth  6 each Topical Daily  . enoxaparin (LOVENOX) injection  40 mg Subcutaneous Q24H  . furosemide  40 mg Intravenous BID  . ipratropium-albuterol  3 mL Nebulization TID  . metoprolol succinate  25 mg Oral Daily  . mirtazapine  15 mg Oral QHS  . pantoprazole  40 mg Oral Daily  . pramipexole  0.25 mg Oral BID  . traZODone  200 mg Oral QHS   Continuous Infusions: PRN Meds:.acetaminophen **OR** acetaminophen, LORazepam, ondansetron **OR** ondansetron (ZOFRAN) IV Medications Prior to Admission:  Prior to Admission  medications   Medication Sig Start Date End Date Taking? Authorizing Provider  albuterol (PROVENTIL HFA;VENTOLIN HFA) 108 (90 Base) MCG/ACT inhaler Inhale 2 puffs into the lungs every 6 (six) hours as needed for wheezing or shortness of breath. 02/07/17  Yes Tyrone Nine, MD  dextromethorphan-guaiFENesin [facility] DM) 30-600 MG 12hr tablet Take 1 tablet by mouth 2 (two) times daily as needed for cough.    Yes [provider]  furosemide (LASIX) 40 MG tablet TAKE 40 MG  Tablet Monday , Wednesday,Fridays - and  If weight gain of 3 lbs, or shortness of breathe , or swelling as needed Patient taking differently: Take 40 mg by mouth 2 (two)  times daily.  01/02/19  Yes Marykay Lex, MD  ipratropium-albuterol (DUONEB) 0.5-2.5 (3) MG/3ML SOLN USE 1 VIAL IN NEBULIZER 3 TIMES DAILY. Generic: DUONEB Patient taking differently: Inhale 3 mLs into the lungs every 6 (six) hours as needed (sob/wheezing).  10/08/18  Yes Mannam, Praveen, MD  metoprolol succinate (TOPROL-XL) 25 MG 24 hr tablet Take 1 tablet (25 mg total) by mouth daily. 01/02/19  Yes Marykay Lex, MD  pantoprazole (PROTONIX) 40 MG tablet TAKE 1 TABLET ONCE DAILY IN THE EVENING. Patient taking differently: Take 40 mg by mouth every evening.  07/26/19  Yes Ardith Dark, MD  pramipexole (MIRAPEX) 0.5 MG tablet TAKE 1/2 TABLET TWICE DAILY. MAY TAKE AN ADDITIONAL 1/2 TABLET IF NEEDED. Patient taking differently: Take 0.25 mg by mouth in the morning and at bedtime.  07/23/19  Yes Ardith Dark, MD  traZODone (DESYREL) 100 MG tablet TAKE 2 TABLETS AT BEDTIME. 06/24/19  Yes Ardith Dark, MD  Fluticasone-Umeclidin-Vilant (TRELEGY ELLIPTA) 100-62.5-25 MCG/INH AEPB Inhale 1 puff into the lungs daily. Patient not taking: Reported on 09/26/2019 05/31/19   Chilton Greathouse, MD   Allergies  Allergen Reactions  . Wellbutrin [Bupropion]    Review of Systems + shortness of breath +confusion  Physical Exam Elderly gentleman currently resting in  bed Does not open eyes to gentle voice command Regular work of breathing S1-S2 Patient has diffuse lower extremity edema erythema multiple abrasions, chronic venous insufficiency type changes evident lower extremities Abdomen mildly distended  Vital Signs: BP 132/72 (BP Location: Left Arm)   Pulse 97   Temp 97.7 F (36.5 C) (Oral)   Resp (!) 22   Ht 5\' 8"  (1.727 m)   Wt 96.2 kg   SpO2 97%   BMI 32.25 kg/m  Pain Scale: 0-10   Pain Score: 0-No pain   SpO2: SpO2: 97 % O2 Device:SpO2: 97 % O2 Flow Rate: .O2 Flow Rate (L/min): 4 L/min  IO: Intake/output summary:   Intake/Output Summary (Last 24 hours) at 09/14/2019 1209 Last data filed at 09/14/2019 0510 Gross per 24 hour  Intake 480 ml  Output 2400 ml  Net -1920 ml    LBM:   Baseline Weight: Weight: 96.6 kg Most recent weight: Weight: 96.2 kg     Palliative Assessment/Data:   PPS 30%  Time In: 11  Time Out: 12  Time Total:  60   Greater than 50%  of this time was spent counseling and coordinating care related to the above assessment and plan.  Signed by: 09/16/2019, MD   Please contact Palliative Medicine Team phone at [phone] for questions and concerns.  For individual provider: See [phone]

## 2019-09-14 NOTE — Plan of Care (Signed)
Will give pain meds to patient as needed

## 2019-09-15 ENCOUNTER — Inpatient Hospital Stay (HOSPITAL_COMMUNITY): Payer: Medicare Other

## 2019-09-15 DIAGNOSIS — R0602 Shortness of breath: Secondary | ICD-10-CM

## 2019-09-15 LAB — BLOOD GAS, ARTERIAL
Acid-Base Excess: 9.3 mmol/L — ABNORMAL HIGH (ref 0.0–2.0)
Bicarbonate: 39 mmol/L — ABNORMAL HIGH (ref 20.0–28.0)
FIO2: 100
O2 Saturation: 90.4 %
Patient temperature: 98.6
pCO2 arterial: 75.8 mmHg (ref 32.0–48.0)
pH, Arterial: 7.331 — ABNORMAL LOW (ref 7.350–7.450)
pO2, Arterial: 64.3 mmHg — ABNORMAL LOW (ref 83.0–108.0)

## 2019-09-15 LAB — BASIC METABOLIC PANEL
Anion gap: 10 (ref 5–15)
BUN: 33 mg/dL — ABNORMAL HIGH (ref 8–23)
CO2: 36 mmol/L — ABNORMAL HIGH (ref 22–32)
Calcium: 8.7 mg/dL — ABNORMAL LOW (ref 8.9–10.3)
Chloride: 97 mmol/L — ABNORMAL LOW (ref 98–111)
Creatinine, Ser: 1.19 mg/dL (ref 0.61–1.24)
GFR calc Af Amer: >60 mL/min (ref >60)
GFR calc non Af Amer: 53 mL/min — ABNORMAL LOW (ref >60)
Glucose, Bld: 120 mg/dL — ABNORMAL HIGH (ref 70–99)
Potassium: 4.5 mmol/L (ref 3.5–5.1)
Sodium: 143 mmol/L (ref 135–145)

## 2019-09-15 MED ORDER — IPRATROPIUM-ALBUTEROL 0.5-2.5 (3) MG/3ML IN SOLN
3.0000 mL | RESPIRATORY_TRACT | Status: DC | PRN
  Administered 2019-09-15: 3 mL via RESPIRATORY_TRACT
  Filled 2019-09-15: qty 3

## 2019-09-15 MED ORDER — HYDROMORPHONE HCL 1 MG/ML IJ SOLN
1.0000 mg | Freq: Four times a day (QID) | INTRAMUSCULAR | Status: DC
  Administered 2019-09-15 (×2): 1 mg via INTRAVENOUS
  Filled 2019-09-15 (×2): qty 1

## 2019-09-15 MED ORDER — MORPHINE SULFATE (PF) 2 MG/ML IV SOLN
1.0000 mg | Freq: Once | INTRAVENOUS | Status: AC
  Administered 2019-09-15: 1 mg via INTRAVENOUS
  Filled 2019-09-15: qty 1

## 2019-09-15 MED ORDER — FUROSEMIDE 10 MG/ML IJ SOLN
40.0000 mg | Freq: Once | INTRAMUSCULAR | Status: AC
  Administered 2019-09-15: 05:00:00 40 mg via INTRAVENOUS
  Filled 2019-09-15: qty 4

## 2019-09-15 MED ORDER — LORAZEPAM 2 MG/ML IJ SOLN: 1.0000 mg | INTRAMUSCULAR | Status: DC | PRN

## 2019-09-15 MED ORDER — MORPHINE 100MG IN NS 100ML (1MG/ML) PREMIX INFUSION
2.0000 mg/h | INTRAVENOUS | Status: DC
  Administered 2019-09-15: 12:00:00 2 mg/h via INTRAVENOUS
  Filled 2019-09-15: qty 100

## 2019-09-16 ENCOUNTER — Telehealth: Payer: Self-pay | Admitting: *Deleted

## 2019-09-16 ENCOUNTER — Telehealth: Payer: Self-pay | Admitting: Family Medicine

## 2019-09-16 NOTE — Telephone Encounter (Signed)
error 

## 2019-09-18 ENCOUNTER — Ambulatory Visit: Payer: Medicare Other | Admitting: Family Medicine

## 2019-09-26 NOTE — Progress Notes (Signed)
PROGRESS NOTE    Warren White  PXT:[id number] DOB: 02-Sep-1925 DOA: 09-29-19 PCP: Ardith Dark, MD  Brief Narrative: 86 y.o.malewith medical history ofCOPD, chronic diastolic CHF, HTN, psoriasis, RLS, GERDwas brought into the hospital by his daughter for history of recurrent falls for the last 3 to 4 days with complaints of dizziness. Patient was supposed to be on Lasix at home but has not been taking it since it makes him urinate quite a bit. He has not been taking his metoprolol regularly as well. Patient was then admitted to hospital for recurrent falls.  Assessment & Plan:   Principal Problem:   Acute diastolic CHF (congestive heart failure) (HCC) Active Problems:   COPD (chronic obstructive pulmonary disease) (HCC)   BPV (benign positional vertigo)   RLS (restless legs syndrome)   GERD (gastroesophageal reflux disease)   BPH (benign prostatic hyperplasia)   Peripheral artery disease (HCC)   Falls   Closed rib fracture   Prolonged QT interval    Acute systolic congestive heart failure-patient took a turn for the worse overnight with profound hypoxia tachycardia tachypnea.  He was placed on 15 L of oxygen.  Stat ABG showed a CO2 of 72.  Discussed with patient's wife and daughter who tells me that patient wants to be comfort care and they want him to be comfort care.  They do not wish any aggressive measures they want him to be a DNR and kept him comfortable until he passes away.  I have put him on comfort care with morphine and Ativan.   Confusion, disorientationlikely secondary to mild alcohol withdrawal, respiratory distress.  Frequent falls. With history of dizziness.   Closedright seventhrib fracture Continue pain control  COPD (chronic obstructive pulmonary disease) with nicotine abuse continue comfort care  RLS (restless legs syndrome) Continue comfort care  GERD (gastroesophageal reflux disease)  Prolonged QT interval  Alcohol  use disorder.  Patient is comfort care  Goals of care patient is DNR on admission.  With multiple comorbidities and decreased functional status and alcohol use patient is comfort care per discussion with family.  Estimated body mass index is 33.12 kg/m as calculated from the following:   Height as of this encounter: 5\' 8"  (1.727 m).   Weight as of this encounter: 98.8 kg.  DVT prophylaxis: none  Code Status: dnr Family Communication: dw wife and daughter Disposition Plan: expect hospital death versus transferred to [facility] if bed available   Consultants:   Palliative care  Procedures: None Antimicrobials: None Subjective: Patient unresponsive overnight had to be placed on 15 L of oxygen since he became more and more hypoxic and agitated he was given morphine and Ativan this morning with decrease in agitation Using accessory muscles Labored breathing noted  Objective: Vitals:   09/13/2019 0745 09/10/2019 0746 09/21/2019 0748 08/26/2019 0858  BP: (!) 80/61   134/78  Pulse: (!) 114   (!) 116  Resp: (!) 38   (!) 34  Temp: 98.7 F (37.1 C)   98.9 F (37.2 C)  TempSrc: Axillary   Oral  SpO2:  91% 91% 92%  Weight:      Height:        Intake/Output Summary (Last 24 hours) at 09/02/2019 1127 Last data filed at 09/03/2019 0200 Gross per 24 hour  Intake 240 ml  Output 2600 ml  Net -2360 ml   Filed Weights   2019/09/29 1143 09/13/19 0453 09/13/2019 0500  Weight: 96.6 kg 96.2 kg 98.8 kg    Examination:  General exam: Appears calm and comfortable  Respiratory system: Coarse breath sounds  to auscultation. Respiratory effort normal. Cardiovascular system: S1 & S2 heard, RRR. No JVD, murmurs, rubs, gallops or clicks. No pedal edema. Gastrointestinal system: Abdomen is nondistended, soft and nontender. No organomegaly or masses felt. Normal bowel sounds heard. Central nervous system: Alert and oriented. No focal neurological deficits. Extremities: 2 plus edema Skin: No rashes,  lesions or ulcers Psychiatry: confused    Data Reviewed: I have personally reviewed following labs and imaging studies  CBC: Recent Labs  Lab 2019/09/23 1223 09/13/19 0457 09/14/19 0435  WBC 7.9 15.0* 13.4*  NEUTROABS 5.6  --   --   HGB 15.7 15.8 15.4  HCT 51.2 52.4* 52.2*  MCV 90.8 90.3 92.2  PLT 164 171 093   Basic Metabolic Panel: Recent Labs  Lab 2019/09/23 1223 09/13/19 0457 09/14/19 0435 09/14/2019 0525  NA 144 146* 146* 143  K 4.3 4.4 4.2 4.5  CL 108 102 101 97*  CO2 29 35* 33* 36*  GLUCOSE 112* 105* 105* 120*  BUN 36* 34* 37* 33*  CREATININE 1.14 1.23 1.24 1.19  CALCIUM 8.8* 8.6* 8.4* 8.7*  MG  --   --  1.9  --    GFR: Estimated Creatinine Clearance: 46.1 mL/min (by C-G formula based on SCr of 1.19 mg/dL). Liver Function Tests: Recent Labs  Lab 09/23/19 1223  AST 23  ALT 29  ALKPHOS 80  BILITOT 0.7  PROT 7.1  ALBUMIN 3.1*   No results for input(s): LIPASE, AMYLASE in the last 168 hours. No results for input(s): AMMONIA in the last 168 hours. Coagulation Profile: No results for input(s): INR, PROTIME in the last 168 hours. Cardiac Enzymes: No results for input(s): CKTOTAL, CKMB, CKMBINDEX, TROPONINI in the last 168 hours. BNP (last 3 results) No results for input(s): PROBNP in the last 8760 hours. HbA1C: No results for input(s): HGBA1C in the last 72 hours. CBG: No results for input(s): GLUCAP in the last 168 hours. Lipid Profile: No results for input(s): CHOL, HDL, LDLCALC, TRIG, CHOLHDL, LDLDIRECT in the last 72 hours. Thyroid Function Tests: No results for input(s): TSH, T4TOTAL, FREET4, T3FREE, THYROIDAB in the last 72 hours. Anemia Panel: No results for input(s): VITAMINB12, FOLATE, FERRITIN, TIBC, IRON, RETICCTPCT in the last 72 hours. Sepsis Labs: No results for input(s): PROCALCITON, LATICACIDVEN in the last 168 hours.  Recent Results (from the past 240 hour(s))  Respiratory Panel by RT PCR (Flu A&B, Covid) - Nasopharyngeal Swab      Status: None   Collection Time: 09-23-19 12:23 PM   Specimen: Nasopharyngeal Swab  Result Value Ref Range Status   SARS Coronavirus 2 by RT PCR NEGATIVE NEGATIVE Final    Comment: (NOTE) SARS-CoV-2 target nucleic acids are NOT DETECTED. The SARS-CoV-2 RNA is generally detectable in upper respiratoy specimens during the acute phase of infection. The lowest concentration of SARS-CoV-2 viral copies this assay can detect is 131 copies/mL. A negative result does not preclude SARS-Cov-2 infection and should not be used as the sole basis for treatment or other patient management decisions. A negative result may occur with  improper specimen collection/handling, submission of specimen other than nasopharyngeal swab, presence of viral mutation(s) within the areas targeted by this assay, and inadequate number of viral copies (<131 copies/mL). A negative result must be combined with clinical observations, patient history, and epidemiological information. The expected result is Negative. Fact Sheet for Patients:  PinkCheek.be Fact Sheet for Healthcare Providers:  GravelBags.it This test is  not yet ap proved or cleared by the [place] and  has been authorized for detection and/or diagnosis of SARS-CoV-2 by FDA under an Emergency Use Authorization (EUA). This EUA will remain  in effect (meaning this test can be used) for the duration of the COVID-19 declaration under Section 564(b)(1) of the Act, 21 U.S.C. section 360bbb-3(b)(1), unless the authorization is terminated or revoked sooner.    Influenza A by PCR NEGATIVE NEGATIVE Final   Influenza B by PCR NEGATIVE NEGATIVE Final    Comment: (NOTE) The Xpert Xpress SARS-CoV-2/FLU/RSV assay is intended as an aid in  the diagnosis of influenza from Nasopharyngeal swab specimens and  should not be used as a sole basis for treatment. Nasal washings and  aspirates are unacceptable for  Xpert Xpress SARS-CoV-2/FLU/RSV  testing. Fact Sheet for Patients: https://www.moore.com/ Fact Sheet for Healthcare Providers: https://www.young.biz/ This test is not yet approved or cleared by the [place] FDA and  has been authorized for detection and/or diagnosis of SARS-CoV-2 by  FDA under an Emergency Use Authorization (EUA). This EUA will remain  in effect (meaning this test can be used) for the duration of the  Covid-19 declaration under Section 564(b)(1) of the Act, 21  U.S.C. section 360bbb-3(b)(1), unless the authorization is  terminated or revoked. Performed at [facility], [address], [place], [place] [address]          Radiology Studies: DG CHEST PORT 1 VIEW  Result Date: 10-12-19 CLINICAL DATA:  Hypoxemia. EXAM: PORTABLE CHEST 1 VIEW COMPARISON:  September 13, 2019. FINDINGS: Stable cardiomegaly. No pneumothorax pleural effusion is noted. Stable bibasilar atelectasis, edema or infiltrates are noted. Bony thorax is unremarkable. IMPRESSION: Stable bibasilar atelectasis, edema or infiltrates. Electronically Signed   By: Lupita Raider M.D.   On: Oct 12, 2019 08:05   DG CHEST PORT 1 VIEW  Result Date: 09/13/2019 CLINICAL DATA:  Decreasing O2 sats. EXAM: PORTABLE CHEST 1 VIEW COMPARISON:  08/30/2019 FINDINGS: 1228 hours. The cardio pericardial silhouette is enlarged. There is pulmonary vascular congestion without overt pulmonary edema. Interstitial markings are diffusely coarsened with chronic features. Patchy airspace opacity in the left mid and lower lung as well as the right base is similar to prior. IMPRESSION: Cardiomegaly with vascular congestion and similar appearance patchy airspace opacity in the lung bases. Electronically Signed   By: Kennith Center M.D.   On: 09/13/2019 13:21   ECHOCARDIOGRAM COMPLETE  Result Date: 09/13/2019    ECHOCARDIOGRAM REPORT   Patient Name:   Warren White Date of Exam:  09/13/2019 Medical Rec #:  [id number]     Height:       68.0 in Accession #:    [id number]    Weight:       212.1 lb Date of Birth:  1925/09/30     BSA:          2.095 m Patient Age:    89 years      BP:           100/71 mmHg Patient Gender: M             HR:           72 bpm. Exam Location:  Inpatient Procedure: 2D Echo, Color Doppler and Cardiac Doppler Indications:    Acute Respiratory Insufficiency R06.89  History:        Patient has prior history of Echocardiogram examinations, most  recent 04/05/2017. CHF, COPD; Risk Factors:Hypertension.  Sonographer:    Irving BurtonEmily Senior RDCS Referring Phys: [id number] SAIMA RIZWAN IMPRESSIONS  1. Left ventricular ejection fraction, by estimation, is 30%. The left ventricle has moderate to severely decreased function. Left ventricular endocardial border not optimally defined to evaluate regional wall motion. Left ventricular diastolic parameters are indeterminate, but suggestive of increased filling pressure.  2. Right ventricular systolic function is mildly reduced. The right ventricular size is severely enlarged. There is moderately elevated pulmonary artery systolic pressure. The estimated right ventricular systolic pressure is 47.3 mmHg.  3. Left atrial size was mildly dilated.  4. Right atrial size was severely dilated.  5. The mitral valve is abnormal. No evidence of mitral valve regurgitation. No evidence of mitral stenosis.  6. The aortic valve is abnormal. Aortic valve regurgitation is not visualized. No aortic stenosis is present.  7. The inferior vena cava is dilated in size with <50% respiratory variability, suggesting right atrial pressure of 15 mmHg. FINDINGS  Left Ventricle: Left ventricular ejection fraction, by estimation, is 30%. The left ventricle has moderate to severely decreased function. Left ventricular endocardial border not optimally defined to evaluate regional wall motion. The left ventricular internal cavity size was normal in size. There is no  left ventricular hypertrophy. Left ventricular diastolic parameters are indeterminate. Right Ventricle: The right ventricular size is severely enlarged. No increase in right ventricular wall thickness. Right ventricular systolic function is mildly reduced. There is moderately elevated pulmonary artery systolic pressure. The tricuspid regurgitant velocity is 2.84 m/s, and with an assumed right atrial pressure of 15 mmHg, the estimated right ventricular systolic pressure is 47.3 mmHg. Left Atrium: Left atrial size was mildly dilated. Right Atrium: Right atrial size was severely dilated. Pericardium: There is no evidence of pericardial effusion. Mitral Valve: The mitral valve is abnormal. There is moderate calcification of the mitral valve leaflet(s). Moderately decreased mobility of the mitral valve leaflets. No evidence of mitral valve regurgitation. No evidence of mitral valve stenosis. Tricuspid Valve: The tricuspid valve is normal in structure. Tricuspid valve regurgitation is mild . No evidence of tricuspid stenosis. Aortic Valve: The aortic valve is abnormal. Aortic valve regurgitation is not visualized. No aortic stenosis is present. Pulmonic Valve: The pulmonic valve was not well visualized. Pulmonic valve regurgitation is trivial. No evidence of pulmonic stenosis. Aorta: The aortic root is normal in size and structure. Venous: The inferior vena cava is dilated in size with less than 50% respiratory variability, suggesting right atrial pressure of 15 mmHg. IAS/Shunts: No atrial level shunt detected by color flow Doppler.  LEFT VENTRICLE PLAX 2D LVIDd:         4.90 cm      Diastology LVIDs:         4.50 cm      LV e' lateral: 2.93 cm/s LV PW:         0.80 cm      LV e' medial:  2.33 cm/s LV IVS:        0.80 cm LVOT diam:     2.40 cm LV SV:         58 LV SV Index:   28 LVOT Area:     4.52 cm  LV Volumes (MOD) LV vol d, MOD A2C: 193.0 ml LV vol d, MOD A4C: 154.0 ml LV vol s, MOD A2C: 155.0 ml LV vol s, MOD A4C:  117.0 ml LV SV MOD A2C:     38.0 ml LV SV MOD A4C:  154.0 ml LV SV MOD BP:      37.5 ml RIGHT VENTRICLE RV S prime:     9.90 cm/s TAPSE (M-mode): 2.0 cm LEFT ATRIUM             Index       RIGHT ATRIUM           Index LA diam:        5.00 cm 2.39 cm/m  RA Area:     38.50 cm LA Vol (A2C):   81.1 ml 38.71 ml/m RA Volume:   178.00 ml 84.95 ml/m LA Vol (A4C):   49.9 ml 23.79 ml/m LA Biplane Vol: 73.8 ml 35.22 ml/m  AORTIC VALVE LVOT Vmax:   75.50 cm/s LVOT Vmean:  52.000 cm/s LVOT VTI:    0.129 m  AORTA Ao Root diam: 3.40 cm TRICUSPID VALVE TR Peak grad:   32.3 mmHg TR Vmax:        284.00 cm/s  SHUNTS Systemic VTI:  0.13 m Systemic Diam: 2.40 cm Weston Brass MD Electronically signed by Weston Brass MD Signature Date/Time: 09/13/2019/5:06:25 PM    Final      Scheduled Meds: . Chlorhexidine Gluconate Cloth  6 each Topical Daily  . furosemide  40 mg Intravenous BID  .  HYDROmorphone (DILAUDID) injection  1 mg Intravenous Q6H  . ipratropium-albuterol  3 mL Nebulization TID   Continuous Infusions: . morphine       LOS: 3 days   Alwyn Ren, MD October 03, 2019, 11:27 AM

## 2019-09-26 NOTE — Progress Notes (Signed)
Pt was on  comfort measure on morphine drip. Found pt unresponsive ,no spontaneous respiration,No  Heart beat on auscultation . Pt passed away at 2300,death pronounced by M.Steffens RN and B.Kadisha Goodine RN. Attending Physician Dr Donnetta Simpers notified. Notified to Daughter Martin,[name] via phone. Daughter was ok to sent body to [place]. Post-mortem care provided and send body to morgue. Received funeral home information from spouse.   After patient passed away ,60 ml of morphine drip wasted in stericycle bin. Witness  M.Steffens RN

## 2019-09-26 NOTE — Progress Notes (Signed)
OT Cancellation Note  Patient Details Name: Warren White MRN: [id number] DOB: 1925-08-16   Cancelled Treatment:    Reason Eval/Treat Not Completed: Other (comment) Pt is unresponsive, has palliative following with plans to go full comfort and transition to hospice care. OT will sign off, thank you for this consult.    Dalphine Handing, MSOT, OTR/L Acute Rehabilitation Services [facility] Office Number: [phone] Pager: [phone]  Dalphine Handing 17-Sep-2019, 1:30 PM

## 2019-09-26 NOTE — Progress Notes (Signed)
   10-02-19 0451  Vitals  Temp 97.6 F (36.4 C)  Temp Source Oral  BP 118/73  MAP (mmHg) 89  BP Location Right Arm  BP Method Automatic  Patient Position (if appropriate) Sitting  Pulse Rate (!) 111  Pulse Rate Source Monitor  Resp (!) 40  Oxygen Therapy  SpO2 (!) 88 %  O2 Device Nasal Cannula  O2 Flow Rate (L/min) 6 L/min  MEWS Score  MEWS Temp 0  MEWS Systolic 0  MEWS Pulse 2  MEWS RR 3  MEWS LOC 0  MEWS Score 5  MEWS Score Color Red  MEWS Assessment  Is this an acute change? Yes  MEWS guidelines implemented *See Row Information* Red  Provider Notification  Provider Name/Title [facility]  Date Provider Notified 10/02/2019  Time Provider Notified 0505  Notification Type Page  Notification Reason Change in status   Respiratory called to bedside to assess patient. Recommending lasix and chest xray. Notified on call MD.

## 2019-09-26 NOTE — Progress Notes (Signed)
SLP Cancellation Note  Patient Details Name: Warren White MRN: [id number] DOB: 03/25/1926   Cancelled treatment:       Reason Eval/Treat Not Completed: Patient's level of consciousness  ST will follow up tomorrow to determine if ST involvement is still desired if the patient is more alert.   Dimas Aguas, MA, CCC-SLP Acute Rehab SLP [phone]  Fleet Contras 09/11/2019, 1:07 PM

## 2019-09-26 NOTE — Progress Notes (Signed)
Daily Progress Note   Patient Name: Warren White       Date: 08/27/2019 DOB: Jul 16, 1925  Age: 86 y.o. MRN#: [id number] Attending Physician: Georgette Shell, MD Primary Care Physician: Vivi Barrack, MD Admit Date: 09-28-2019  Reason for Consultation/Follow-up: Establishing goals of care  Subjective: Patient is essentially unresponsive.  Labored breathing.  Using abdominal accessory muscles of respiration.  Does not arouse.  On nonrebreather oxygen.  Wife daughter and son-in-law present at the bedside.  Discussed with TRH MD.  See below.  Length of Stay: 3  Current Medications: Scheduled Meds:  . Chlorhexidine Gluconate Cloth  6 each Topical Daily  . furosemide  40 mg Intravenous BID  .  HYDROmorphone (DILAUDID) injection  1 mg Intravenous Q6H  . ipratropium-albuterol  3 mL Nebulization TID    Continuous Infusions: . morphine      PRN Meds: HYDROmorphone (DILAUDID) injection, ipratropium-albuterol, LORazepam, ondansetron **OR** ondansetron (ZOFRAN) IV  Physical Exam         Coarse breath sounds Not awake not alert Is on nonrebreather Abdomen mildly distended Has 2+ edema   Vital Signs: BP 134/78 (BP Location: Right Arm)   Pulse (!) 116   Temp 98.9 F (37.2 C) (Oral)   Resp (!) 34   Ht 5\' 8"  (1.727 m)   Wt 98.8 kg   SpO2 92%   BMI 33.12 kg/m  SpO2: SpO2: 92 % O2 Device: O2 Device: NRB O2 Flow Rate: O2 Flow Rate (L/min): 15 L/min  Intake/output summary:   Intake/Output Summary (Last 24 hours) at 09/09/2019 1106 Last data filed at 08/26/2019 0200 Gross per 24 hour  Intake 240 ml  Output 2600 ml  Net -2360 ml   LBM:   Baseline Weight: Weight: 96.6 kg Most recent weight: Weight: 98.8 kg       Palliative Assessment/Data:      Patient Active Problem  List   Diagnosis Date Noted  . Acute diastolic CHF (congestive heart failure) ([place]) 09/28/2019  . Falls 09/28/19  . Closed rib fracture 09/28/2019  . Prolonged QT interval 09-28-2019  . Thyroid cyst 08/19/2019  . Depressed mood 07/25/2019  . Venous stasis dermatitis without varicosities 01/04/2019  . Peripheral artery disease ([place]) 04/16/2018  . Chronic diastolic CHF (congestive heart failure) ([place]) 02/04/2017  . Encounter for palliative care   .  COPD (chronic obstructive pulmonary disease) (HCC) 02/02/2017  . BPV (benign positional vertigo) 02/02/2017  . RLS (restless legs syndrome) 02/02/2017  . GERD (gastroesophageal reflux disease) 02/02/2017  . Psoriasis 02/02/2017  . BPH (benign prostatic hyperplasia) 02/02/2017    Palliative Care Assessment & Plan   Patient Profile:  86 year old gentleman known to palliative medicine service seen in consultation in a previous hospitalization, history of COPD, chronic diastolic congestive heart failure, hypertension psoriasis restless leg syndrome and gastroesophageal reflux disease.  Patient admitted to hospital medicine service for acute diastolic congestive heart failure exacerbation, recurrent falls, worsening lower extremity edema.  Patient's hospital course complicated by ongoing confusion and disorientation, possibly mild alcohol withdrawal.  Also has underlying COPD.  Palliative consultation for goals of care discussions has been requested.   Assessment: Acute diastolic congestive heart failure History of frequent falls and closed right seventh rib fracture Chronic obstructive pulmonary disease Ongoing encephalopathy, possibly terminal delirium  Recommendations/Plan:  Family meeting with wife daughter and son-in-law present at the bedside.  Reviewed about patient's current condition.  Discussed about end-of-life care and comfort measures and aggressive symptom management.  Discussed about hospice philosophy of care,  specifically the type of care that can be provided in a residential hospice setting.  Discussed about judicious use of opioids and benzodiazepines.  Discussed about limited prognosis of likely as short as few days to as long as 1 week or so at this point in time.  Plan: Agree with continuous opioids, will request transitions of care consult for residential hospice arrangements.  Goals of Care and Additional Recommendations:  Limitations on Scope of Treatment: Full Comfort Care  Code Status:    Code Status Orders  (From admission, onward)         Start     Ordered   10/02/2019 1507  Do not attempt resuscitation (DNR)  Continuous    Question Answer Comment  In the event of cardiac or respiratory ARREST Do not call a "code blue"   In the event of cardiac or respiratory ARREST Do not perform Intubation, CPR, defibrillation or ACLS   In the event of cardiac or respiratory ARREST Use medication by any route, position, wound care, and other measures to relive pain and suffering. May use oxygen, suction and manual treatment of airway obstruction as needed for comfort.      10-02-19 1506        Code Status History    Date Active Date Inactive Code Status Order ID Comments User Context   02/02/2017 1438 02/07/2017 1658 DNR [id number]  Pieter Partridge, MD Inpatient   02/02/2017 1234 02/02/2017 1438 DNR [id number]  Lavera Guise, MD ED   Advance Care Planning Activity    Advance Directive Documentation     Most Recent Value  Type of Advance Directive  Living will, Healthcare Power of Attorney  Pre-existing out of facility DNR order (yellow form or pink MOST form)  --  "MOST" Form in Place?  --       Prognosis:   < 2 weeks  Discharge Planning:  Hospice facility  Care plan was discussed with patient's wife, daughter and son-in-law present at the bedside.  Also discussed with TRH MD.  Thank you for allowing the Palliative Medicine Team to assist in the care of this  patient.   Time In: 10.30 Time Out: 11.05 Total Time 35 Prolonged Time Billed  no       Greater than 50%  of this time was spent counseling and  coordinating care related to the above assessment and plan.  Rosalin Hawking, MD  Please contact Palliative Medicine Team phone at [phone] for questions and concerns.

## 2019-09-26 NOTE — Death Summary Note (Signed)
Death Summary  Warren White QIH:[id number] DOB: 02-27-1926 DOA: 10-03-19  PCP: Ardith Dark, MD  Admit date: 2019-10-03 Date of Death: 10/10/19 Time of Death: 02-Nov-2298  notification: Ardith Dark, MD notified of death of 2019-10-10   History of present illness:  Warren White is a 86 y.o. male with a history of COPD, chronic diastolic CHF, alcohol abuse, hypertension, GERD, restless leg syndrome, psoriasis admitted with recurrent falls and dizziness. Warren White did not improve after diuresis and steroids.  He was also treated with CIWA protocol for alcohol withdrawal.  He became very agitated confused tachypneic tachycardic and extremely hypoxic. Discussed with patient's wife and daughter-patient was made comfort care per patient and family request. He was also seen by palliative care in consult.  Final Diagnoses:  1 acute diastolic heart failure  COPD exacerbation Alcohol withdrawal    The results of significant diagnostics from this hospitalization (including imaging, microbiology, ancillary and laboratory) are listed below for reference.    Significant Diagnostic Studies: CT Head Wo Contrast  Result Date: 10-03-2019 CLINICAL DATA:  Fall EXAM: CT HEAD WITHOUT CONTRAST TECHNIQUE: Contiguous axial images were obtained from the base of the skull through the vertex without intravenous contrast. COMPARISON:  None. FINDINGS: Brain: No evidence of acute infarction, hemorrhage, hydrocephalus, extra-axial collection or mass lesion/mass effect. Mild periventricular white matter hypodensity. Vascular: No hyperdense vessel or unexpected calcification. Skull: Normal. Negative for fracture or focal lesion. Sinuses/Orbits: No acute finding. Other: None. IMPRESSION: No acute intracranial pathology. Mild small-vessel white matter disease. Electronically Signed   By: Lauralyn Primes M.D.   On: Oct 03, 2019 14:04   DG CHEST PORT 1 VIEW  Result Date: 09/21/2019 CLINICAL DATA:  Hypoxemia. EXAM:  PORTABLE CHEST 1 VIEW COMPARISON:  September 13, 2019. FINDINGS: Stable cardiomegaly. No pneumothorax pleural effusion is noted. Stable bibasilar atelectasis, edema or infiltrates are noted. Bony thorax is unremarkable. IMPRESSION: Stable bibasilar atelectasis, edema or infiltrates. Electronically Signed   By: Lupita Raider M.D.   On: 09/25/2019 08:05   DG CHEST PORT 1 VIEW  Result Date: 09/13/2019 CLINICAL DATA:  Decreasing O2 sats. EXAM: PORTABLE CHEST 1 VIEW COMPARISON:  03-Oct-2019 FINDINGS: 1228 hours. The cardio pericardial silhouette is enlarged. There is pulmonary vascular congestion without overt pulmonary edema. Interstitial markings are diffusely coarsened with chronic features. Patchy airspace opacity in the left mid and lower lung as well as the right base is similar to prior. IMPRESSION: Cardiomegaly with vascular congestion and similar appearance patchy airspace opacity in the lung bases. Electronically Signed   By: Kennith Center M.D.   On: 09/13/2019 13:21   DG Chest Port 1 View  Result Date: 10-03-2019 CLINICAL DATA:  Shortness of breath. Tripped on curb leading to fall. Right rib pain. EXAM: PORTABLE CHEST 1 VIEW COMPARISON:  Radiograph 05/31/2019, CT 07/24/2019 FINDINGS: Cardiomegaly with unchanged mediastinal contours. Vascular congestion with interstitial and bronchial thickening. No pneumothorax or confluent airspace disease. No large pleural effusion. Probable minimally displaced right lateral seventh rib fracture. IMPRESSION: 1. Probable minimally displaced right lateral seventh rib fracture. No pneumothorax. 2. Chronic cardiomegaly. Vascular congestion with suggestion of mild pulmonary edema. Electronically Signed   By: Narda Rutherford M.D.   On: 10/03/19 13:39   ECHOCARDIOGRAM COMPLETE  Result Date: 09/13/2019    ECHOCARDIOGRAM REPORT   Patient Name:   ADRIENNE DELAY Date of Exam: 09/13/2019 Medical Rec #:  [id number]     Height:       68.0 in Accession #:    [id number]  Weight:        212.1 lb Date of Birth:  11-11-1925     BSA:          2.095 m Patient Age:    89 years      BP:           100/71 mmHg Patient Gender: M             HR:           72 bpm. Exam Location:  Inpatient Procedure: 2D Echo, Color Doppler and Cardiac Doppler Indications:    Acute Respiratory Insufficiency R06.89  History:        Patient has prior history of Echocardiogram examinations, most                 recent 04/05/2017. CHF, COPD; Risk Factors:Hypertension.  Sonographer:    Irving Burton Senior RDCS Referring Phys: [id number] SAIMA RIZWAN IMPRESSIONS  1. Left ventricular ejection fraction, by estimation, is 30%. The left ventricle has moderate to severely decreased function. Left ventricular endocardial border not optimally defined to evaluate regional wall motion. Left ventricular diastolic parameters are indeterminate, but suggestive of increased filling pressure.  2. Right ventricular systolic function is mildly reduced. The right ventricular size is severely enlarged. There is moderately elevated pulmonary artery systolic pressure. The estimated right ventricular systolic pressure is 47.3 mmHg.  3. Left atrial size was mildly dilated.  4. Right atrial size was severely dilated.  5. The mitral valve is abnormal. No evidence of mitral valve regurgitation. No evidence of mitral stenosis.  6. The aortic valve is abnormal. Aortic valve regurgitation is not visualized. No aortic stenosis is present.  7. The inferior vena cava is dilated in size with <50% respiratory variability, suggesting right atrial pressure of 15 mmHg. FINDINGS  Left Ventricle: Left ventricular ejection fraction, by estimation, is 30%. The left ventricle has moderate to severely decreased function. Left ventricular endocardial border not optimally defined to evaluate regional wall motion. The left ventricular internal cavity size was normal in size. There is no left ventricular hypertrophy. Left ventricular diastolic parameters are indeterminate. Right  Ventricle: The right ventricular size is severely enlarged. No increase in right ventricular wall thickness. Right ventricular systolic function is mildly reduced. There is moderately elevated pulmonary artery systolic pressure. The tricuspid regurgitant velocity is 2.84 m/s, and with an assumed right atrial pressure of 15 mmHg, the estimated right ventricular systolic pressure is 47.3 mmHg. Left Atrium: Left atrial size was mildly dilated. Right Atrium: Right atrial size was severely dilated. Pericardium: There is no evidence of pericardial effusion. Mitral Valve: The mitral valve is abnormal. There is moderate calcification of the mitral valve leaflet(s). Moderately decreased mobility of the mitral valve leaflets. No evidence of mitral valve regurgitation. No evidence of mitral valve stenosis. Tricuspid Valve: The tricuspid valve is normal in structure. Tricuspid valve regurgitation is mild . No evidence of tricuspid stenosis. Aortic Valve: The aortic valve is abnormal. Aortic valve regurgitation is not visualized. No aortic stenosis is present. Pulmonic Valve: The pulmonic valve was not well visualized. Pulmonic valve regurgitation is trivial. No evidence of pulmonic stenosis. Aorta: The aortic root is normal in size and structure. Venous: The inferior vena cava is dilated in size with less than 50% respiratory variability, suggesting right atrial pressure of 15 mmHg. IAS/Shunts: No atrial level shunt detected by color flow Doppler.  LEFT VENTRICLE PLAX 2D LVIDd:         4.90 cm      Diastology  LVIDs:         4.50 cm      LV e' lateral: 2.93 cm/s LV PW:         0.80 cm      LV e' medial:  2.33 cm/s LV IVS:        0.80 cm LVOT diam:     2.40 cm LV SV:         58 LV SV Index:   28 LVOT Area:     4.52 cm  LV Volumes (MOD) LV vol d, MOD A2C: 193.0 ml LV vol d, MOD A4C: 154.0 ml LV vol s, MOD A2C: 155.0 ml LV vol s, MOD A4C: 117.0 ml LV SV MOD A2C:     38.0 ml LV SV MOD A4C:     154.0 ml LV SV MOD BP:      37.5 ml  RIGHT VENTRICLE RV S prime:     9.90 cm/s TAPSE (M-mode): 2.0 cm LEFT ATRIUM             Index       RIGHT ATRIUM           Index LA diam:        5.00 cm 2.39 cm/m  RA Area:     38.50 cm LA Vol (A2C):   81.1 ml 38.71 ml/m RA Volume:   178.00 ml 84.95 ml/m LA Vol (A4C):   49.9 ml 23.79 ml/m LA Biplane Vol: 73.8 ml 35.22 ml/m  AORTIC VALVE LVOT Vmax:   75.50 cm/s LVOT Vmean:  52.000 cm/s LVOT VTI:    0.129 m  AORTA Ao Root diam: 3.40 cm TRICUSPID VALVE TR Peak grad:   32.3 mmHg TR Vmax:        284.00 cm/s  SHUNTS Systemic VTI:  0.13 m Systemic Diam: 2.40 cm Weston Brass MD Electronically signed by Weston Brass MD Signature Date/Time: 09/13/2019/5:06:25 PM    Final    US THYROID  Result Date: 08/30/2019 CLINICAL DATA:  Incidental on CT. 86 year old male with thyroid nodule seen on recent CT scan of the chest EXAM: THYROID ULTRASOUND TECHNIQUE: Ultrasound examination of the thyroid gland and adjacent soft tissues was performed. COMPARISON:  CT scan of the chest July 24, 2019 FINDINGS: Parenchymal Echotexture: Moderately heterogenous Isthmus: 0.6 cm Right lobe: 5.5 x 3.3 x 2.1 cm Left lobe: 6.2 x 3.7 x 3.2 cm _________________________________________________________ Estimated total number of nodules >/= 1 cm: 3 Number of spongiform nodules >/=  2 cm not described below (TR1): 0 Number of mixed cystic and solid nodules >/= 1.5 cm not described below (TR2): 0 _________________________________________________________ Nodule # 2: Location: Right; Inferior Maximum size: 2.5 cm; Other 2 dimensions: 2.3 x 2.1 cm Composition: solid/almost completely solid (2) Echogenicity: isoechoic (1) Shape: not taller-than-wide (0) Margins: ill-defined (0) Echogenic foci: macrocalcifications (1) ACR TI-RADS total points: 4. ACR TI-RADS risk category: TR4 (4-6 points). ACR TI-RADS recommendations: **Given size (>/= 1.5 cm) and appearance, fine needle aspiration of this moderately suspicious nodule should be considered based on  TI-RADS criteria. _________________________________________________________ Nodule # 4: Location: Left; Mid Maximum size: 3.2 cm; Other 2 dimensions: 2.9 x 2.7 cm Composition: solid/almost completely solid (2) Echogenicity: isoechoic (1) Shape: not taller-than-wide (0) Margins: ill-defined (0) Echogenic foci: none (0) ACR TI-RADS total points: 3. ACR TI-RADS risk category: TR3 (3 points). ACR TI-RADS recommendations: **Given size (>/= 2.5 cm) and appearance, fine needle aspiration of this mildly suspicious nodule should be considered based on TI-RADS criteria. _________________________________________________________ Additional small  solid thyroid nodule in the lateral right mid gland measures less than 1 cm and requires no further follow-up. There is a minimally complex cyst in the left upper thyroid gland which is sonographically benign. IMPRESSION: Diffusely enlarged, heterogeneous and multinodular thyroid gland most consistent with multinodular goiter. Technically speaking, nodules within the right lower and left mid gland (labeled # 2 and # 4 above) would meet criteria to consider fine-needle aspiration biopsy. However, given patient's advanced age, clinical surveillance would also be a reasonable strategy. The above is in keeping with the ACR TI-RADS recommendations - J Am Coll Radiol 2017;14:587-595. Electronically Signed   By: Malachy MoanHeath  McCullough M.D.   On: 08/30/2019 15:21    Microbiology: Recent Results (from the past 240 hour(s))  Respiratory Panel by RT PCR (Flu A&B, Covid) - Nasopharyngeal Swab     Status: None   Collection Time: February 29, 2020 12:23 PM   Specimen: Nasopharyngeal Swab  Result Value Ref Range Status   SARS Coronavirus 2 by RT PCR NEGATIVE NEGATIVE Final    Comment: (NOTE) SARS-CoV-2 target nucleic acids are NOT DETECTED. The SARS-CoV-2 RNA is generally detectable in upper respiratoy specimens during the acute phase of infection. The lowest concentration of SARS-CoV-2 viral copies  this assay can detect is 131 copies/mL. A negative result does not preclude SARS-Cov-2 infection and should not be used as the sole basis for treatment or other patient management decisions. A negative result may occur with  improper specimen collection/handling, submission of specimen other than nasopharyngeal swab, presence of viral mutation(s) within the areas targeted by this assay, and inadequate number of viral copies (<131 copies/mL). A negative result must be combined with clinical observations, patient history, and epidemiological information. The expected result is Negative. Fact Sheet for Patients:  https://www.moore.com/https://www.fda.gov/media/142436/download Fact Sheet for Healthcare Providers:  https://www.young.biz/https://www.fda.gov/media/142435/download This test is not yet ap proved or cleared by the [place] FDA and  has been authorized for detection and/or diagnosis of SARS-CoV-2 by FDA under an Emergency Use Authorization (EUA). This EUA will remain  in effect (meaning this test can be used) for the duration of the COVID-19 declaration under Section 564(b)(1) of the Act, 21 U.S.C. section 360bbb-3(b)(1), unless the authorization is terminated or revoked sooner.    Influenza A by PCR NEGATIVE NEGATIVE Final   Influenza B by PCR NEGATIVE NEGATIVE Final    Comment: (NOTE) The Xpert Xpress SARS-CoV-2/FLU/RSV assay is intended as an aid in  the diagnosis of influenza from Nasopharyngeal swab specimens and  should not be used as a sole basis for treatment. Nasal washings and  aspirates are unacceptable for Xpert Xpress SARS-CoV-2/FLU/RSV  testing. Fact Sheet for Patients: https://www.moore.com/https://www.fda.gov/media/142436/download Fact Sheet for Healthcare Providers: https://www.young.biz/https://www.fda.gov/media/142435/download This test is not yet approved or cleared by the [place] FDA and  has been authorized for detection and/or diagnosis of SARS-CoV-2 by  FDA under an Emergency Use Authorization (EUA). This EUA will remain  in  effect (meaning this test can be used) for the duration of the  Covid-19 declaration under Section 564(b)(1) of the Act, 21  U.S.C. section 360bbb-3(b)(1), unless the authorization is  terminated or revoked. Performed at [facility], [address] [place], [place] [id number]      Labs: Basic Metabolic Panel: Recent Labs  Lab 09/13/19 0457 09/13/19 0457 09/14/19 0435 09/09/2019 0525  NA 146*  --  146* 143  K 4.4   < > 4.2 4.5  CL 102  --  101 97*  CO2 35*  --  33* 36*  GLUCOSE 105*  --  105* 120*  BUN 34*  --  37* 33*  CREATININE 1.23  --  1.24 1.19  CALCIUM 8.6*  --  8.4* 8.7*  MG  --   --  1.9  --    < > = values in this interval not displayed.   Liver Function Tests: No results for input(s): AST, ALT, ALKPHOS, BILITOT, PROT, ALBUMIN in the last 168 hours. No results for input(s): LIPASE, AMYLASE in the last 168 hours. No results for input(s): AMMONIA in the last 168 hours. CBC: Recent Labs  Lab 09/13/19 0457 09/14/19 0435  WBC 15.0* 13.4*  HGB 15.8 15.4  HCT 52.4* 52.2*  MCV 90.3 92.2  PLT 171 160   Cardiac Enzymes: No results for input(s): CKTOTAL, CKMB, CKMBINDEX, TROPONINI in the last 168 hours. D-Dimer No results for input(s): DDIMER in the last 72 hours. BNP: Invalid input(s): POCBNP CBG: No results for input(s): GLUCAP in the last 168 hours. Anemia work up No results for input(s): VITAMINB12, FOLATE, FERRITIN, TIBC, IRON, RETICCTPCT in the last 72 hours. Urinalysis    Component Value Date/Time   COLORURINE YELLOW 09/14/2019 1329   APPEARANCEUR CLOUDY (A) 09/14/2019 1329   LABSPEC 1.013 09/14/2019 1329   PHURINE 5.0 09/14/2019 1329   GLUCOSEU NEGATIVE 09/14/2019 1329   HGBUR LARGE (A) 09/14/2019 1329   BILIRUBINUR NEGATIVE 09/14/2019 1329   KETONESUR NEGATIVE 09/14/2019 1329   PROTEINUR 30 (A) 09/14/2019 1329   NITRITE NEGATIVE 09/14/2019 1329   LEUKOCYTESUR SMALL (A) 09/14/2019 1329   Sepsis Labs Invalid input(s):  PROCALCITONIN,  WBC,  LACTICIDVEN     SIGNED:  Georgette Shell, MD  [facility] 09/19/2019, 2:56 PM   If 7PM-7AM, please contact night-coverage [email or web address] Password [email or web address]

## 2019-09-26 NOTE — Progress Notes (Signed)
Pt status declining. MD spoke with family and would like to go forth with comfort care measures. Pt placed on comfort care measures and morphine drip started.

## 2019-09-26 NOTE — Progress Notes (Signed)
CRITICAL VALUE ALERT  Critical Value:  PCO2 75.2  Date & Time Notied:  10-10-2019 0756  Provider Notified: Dr.; Jerelene Redden  Orders Received/Actions taken: n/a

## 2019-09-26 NOTE — Progress Notes (Signed)
Pt's daughter and son-in-law were bedside when I arrived. They have explained and have accepted the potential outcome of this hospitalization. She said they just want him comfortable and that he will not suffer. She mentioned that his wife is Emergency planning/management officer and they wanted prayer for him explaining that he is a Protestant (as are daughter and son-in-law).  After having prayer bedside of pt, they were very appreciative. They did not have additional needs. I encouraged them to have a nurse to page chaplain support if needed.  Please call [phone] if additional support is needed. Chaplain Elmarie Shiley, MDiv   09/22/2019 1700  Clinical Encounter Type  Visited With Family

## 2019-09-26 DEATH — deceased

## 2020-10-25 IMAGING — DX DG CHEST 1V PORT
2 series · 2 of 2 positions shown · non-contrast
Comparison: September 13, 2019.

CLINICAL DATA: Hypoxemia.

EXAM:
PORTABLE CHEST 1 VIEW

[chest ap (1 of 2)]
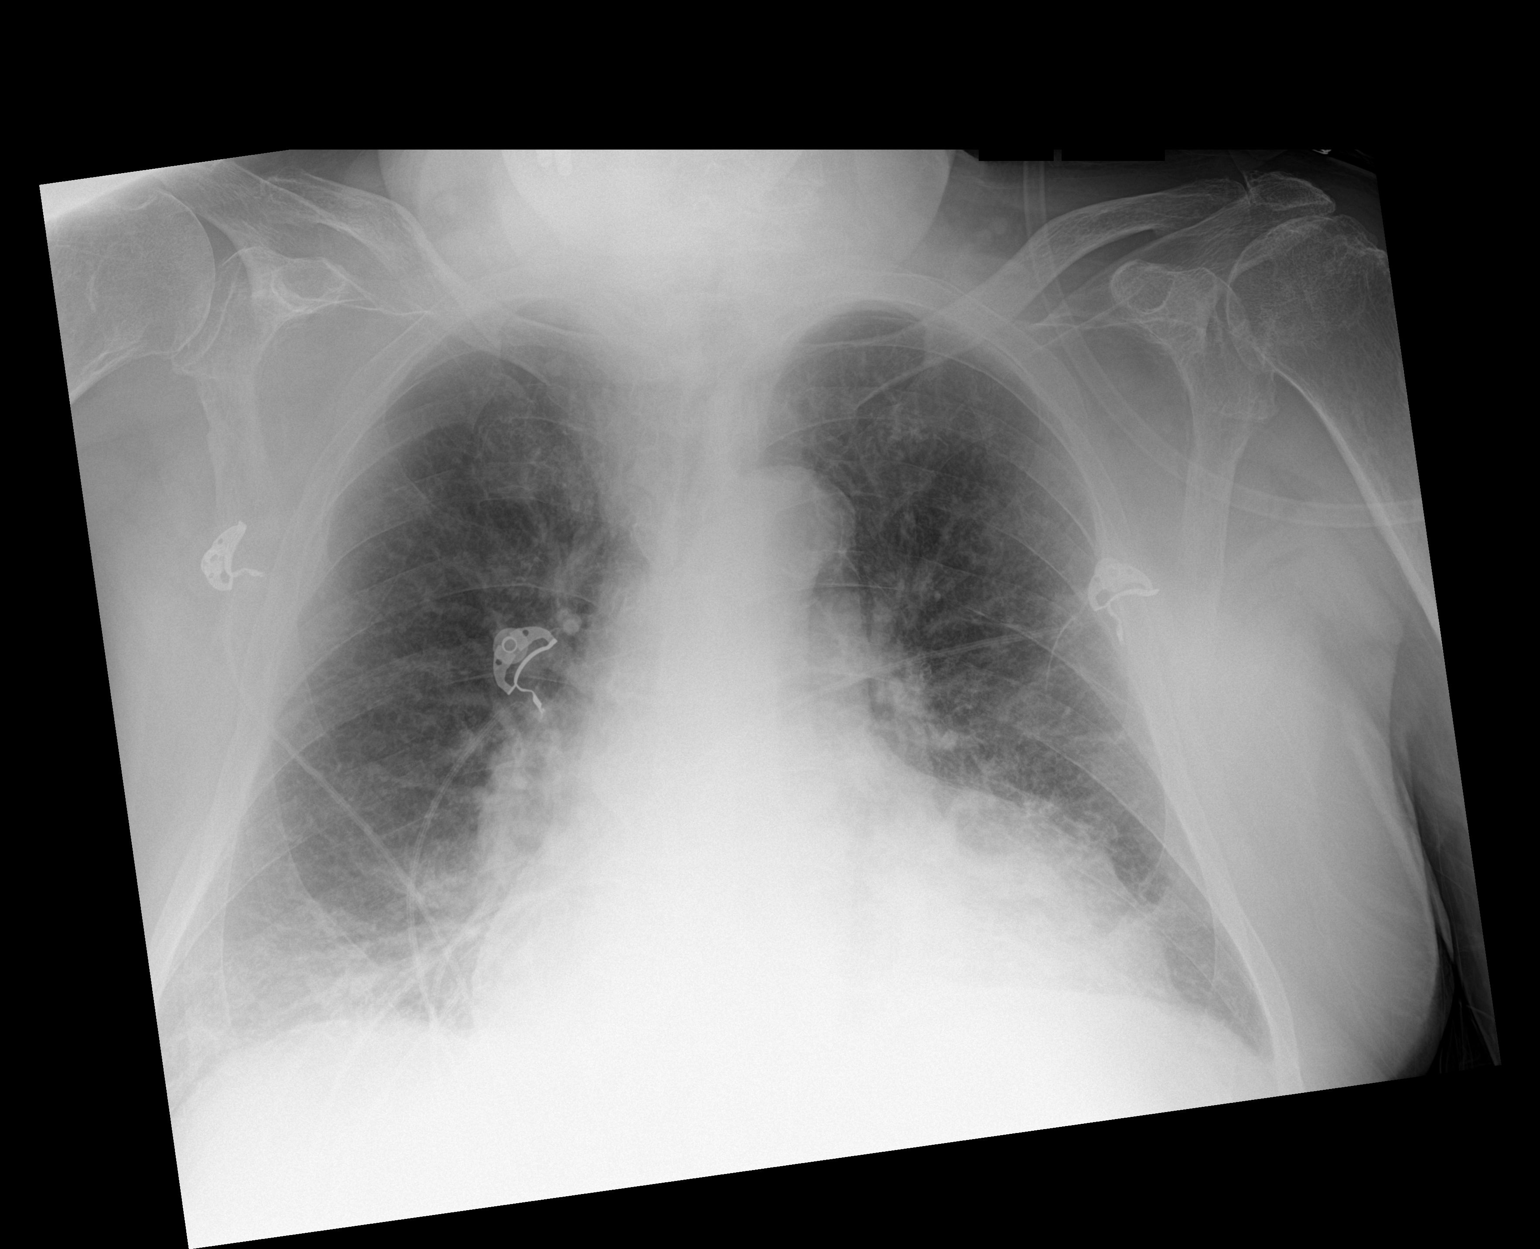

[chest ap (2 of 2)]
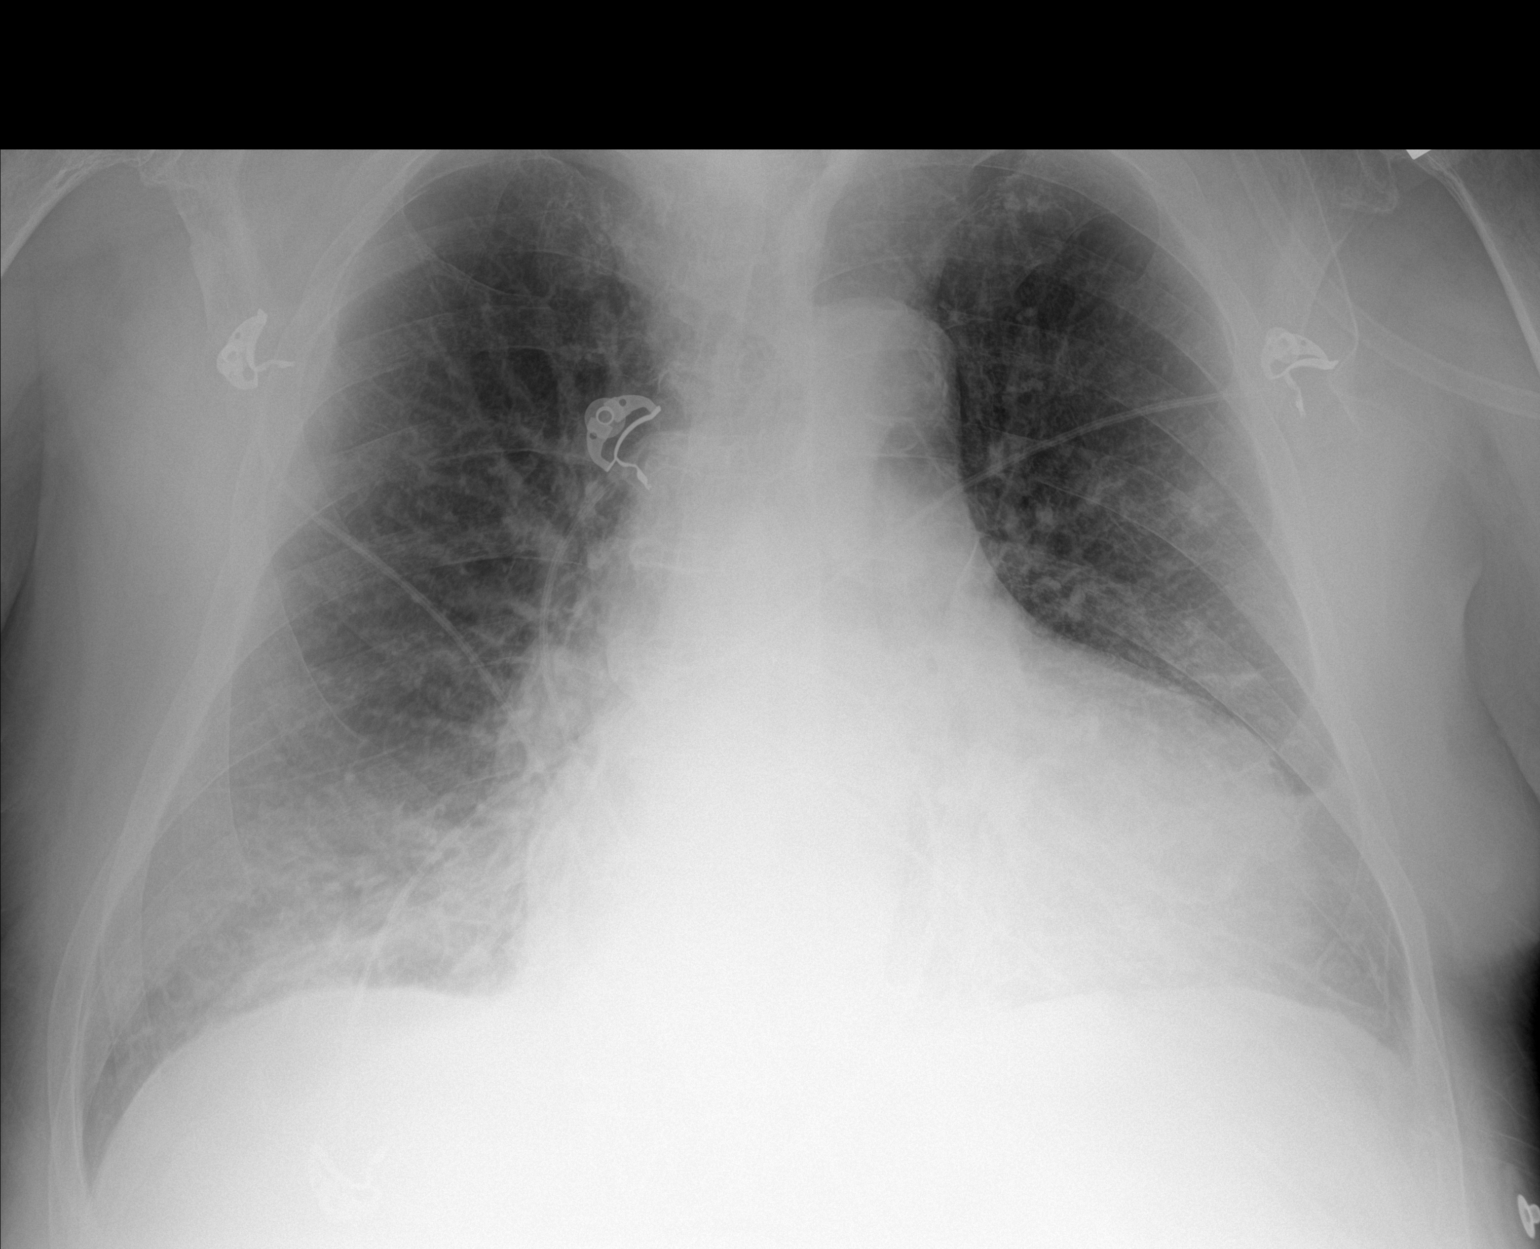

[2 of 2 positions shown; findings below may reference images not displayed]

FINDINGS: Stable cardiomegaly. No pneumothorax pleural effusion is noted.
Stable bibasilar atelectasis, edema or infiltrates are noted. Bony
thorax is unremarkable.
IMPRESSION: Stable bibasilar atelectasis, edema or infiltrates.
